# Patient Record
Sex: Female | Born: 1945
Health system: Southern US, Community
[De-identification: ages and names within clinical notes are randomized; demographics above are authoritative.]

## PROBLEM LIST (undated history)

## (undated) DIAGNOSIS — D899 Disorder involving the immune mechanism, unspecified: Secondary | ICD-10-CM

## (undated) DIAGNOSIS — Z8249 Family history of ischemic heart disease and other diseases of the circulatory system: Secondary | ICD-10-CM

## (undated) DIAGNOSIS — M810 Age-related osteoporosis without current pathological fracture: Secondary | ICD-10-CM

## (undated) DIAGNOSIS — M1990 Unspecified osteoarthritis, unspecified site: Secondary | ICD-10-CM

## (undated) DIAGNOSIS — E785 Hyperlipidemia, unspecified: Secondary | ICD-10-CM

## (undated) DIAGNOSIS — R0600 Dyspnea, unspecified: Secondary | ICD-10-CM

## (undated) DIAGNOSIS — H539 Unspecified visual disturbance: Secondary | ICD-10-CM

## (undated) DIAGNOSIS — C50919 Malignant neoplasm of unspecified site of unspecified female breast: Secondary | ICD-10-CM

## (undated) DIAGNOSIS — M419 Scoliosis, unspecified: Secondary | ICD-10-CM

## (undated) DIAGNOSIS — M797 Fibromyalgia: Secondary | ICD-10-CM

## (undated) DIAGNOSIS — M3500 Sicca syndrome, unspecified: Secondary | ICD-10-CM

## (undated) DIAGNOSIS — N3010 Interstitial cystitis (chronic) without hematuria: Secondary | ICD-10-CM

## (undated) DIAGNOSIS — C801 Malignant (primary) neoplasm, unspecified: Secondary | ICD-10-CM

## (undated) HISTORY — DX: Age-related osteoporosis without current pathological fracture: M81.0

## (undated) HISTORY — PX: APPENDECTOMY: SHX54

## (undated) HISTORY — DX: Sjogren syndrome, unspecified: M35.00

## (undated) HISTORY — DX: Dyspnea, unspecified: R06.00

## (undated) HISTORY — DX: Family history of ischemic heart disease and other diseases of the circulatory system: Z82.49

## (undated) HISTORY — DX: Fibromyalgia: M79.7

## (undated) HISTORY — DX: Disorder involving the immune mechanism, unspecified: D89.9

## (undated) HISTORY — DX: Unspecified osteoarthritis, unspecified site: M19.90

## (undated) HISTORY — PX: TUMOR REMOVAL: SHX12

## (undated) HISTORY — DX: Malignant neoplasm of unspecified site of unspecified female breast: C50.919

## (undated) HISTORY — DX: Unspecified visual disturbance: H53.9

## (undated) HISTORY — DX: Malignant (primary) neoplasm, unspecified: C80.1

## (undated) HISTORY — PX: MASTECTOMY: SHX3

## (undated) HISTORY — DX: Interstitial cystitis (chronic) without hematuria: N30.10

## (undated) HISTORY — DX: Hyperlipidemia, unspecified: E78.5

## (undated) HISTORY — DX: Scoliosis, unspecified: M41.9

---

## 1998-05-17 ENCOUNTER — Other Ambulatory Visit: Admission: RE | Admit: 1998-05-17 | Discharge: 1998-05-17 | Payer: Self-pay | Admitting: Surgery

## 1998-05-21 ENCOUNTER — Ambulatory Visit (HOSPITAL_BASED_OUTPATIENT_CLINIC_OR_DEPARTMENT_OTHER): Admission: RE | Admit: 1998-05-21 | Discharge: 1998-05-21 | Payer: Self-pay | Admitting: Surgery

## 1998-06-27 ENCOUNTER — Inpatient Hospital Stay (HOSPITAL_COMMUNITY): Admission: RE | Admit: 1998-06-27 | Discharge: 1998-06-29 | Payer: Self-pay | Admitting: Surgery

## 1998-08-13 ENCOUNTER — Other Ambulatory Visit: Admission: RE | Admit: 1998-08-13 | Discharge: 1998-08-13 | Payer: Self-pay | Admitting: *Deleted

## 1998-09-18 ENCOUNTER — Other Ambulatory Visit: Admission: RE | Admit: 1998-09-18 | Discharge: 1998-09-18 | Payer: Self-pay | Admitting: *Deleted

## 1998-10-16 ENCOUNTER — Ambulatory Visit (HOSPITAL_BASED_OUTPATIENT_CLINIC_OR_DEPARTMENT_OTHER): Admission: RE | Admit: 1998-10-16 | Discharge: 1998-10-16 | Payer: Self-pay | Admitting: Plastic Surgery

## 1998-12-14 ENCOUNTER — Ambulatory Visit (HOSPITAL_BASED_OUTPATIENT_CLINIC_OR_DEPARTMENT_OTHER): Admission: RE | Admit: 1998-12-14 | Discharge: 1998-12-14 | Payer: Self-pay | Admitting: Plastic Surgery

## 2000-02-21 ENCOUNTER — Other Ambulatory Visit: Admission: RE | Admit: 2000-02-21 | Discharge: 2000-02-21 | Payer: Self-pay | Admitting: *Deleted

## 2001-03-01 ENCOUNTER — Encounter: Payer: Self-pay | Admitting: Internal Medicine

## 2001-03-01 ENCOUNTER — Encounter: Admission: RE | Admit: 2001-03-01 | Discharge: 2001-03-01 | Payer: Self-pay | Admitting: Internal Medicine

## 2001-03-01 ENCOUNTER — Other Ambulatory Visit: Admission: RE | Admit: 2001-03-01 | Discharge: 2001-03-01 | Payer: Self-pay | Admitting: Internal Medicine

## 2001-03-08 ENCOUNTER — Encounter: Payer: Self-pay | Admitting: Internal Medicine

## 2001-03-08 ENCOUNTER — Encounter: Admission: RE | Admit: 2001-03-08 | Discharge: 2001-03-08 | Payer: Self-pay | Admitting: Internal Medicine

## 2001-03-12 ENCOUNTER — Encounter: Payer: Self-pay | Admitting: Internal Medicine

## 2001-03-12 ENCOUNTER — Ambulatory Visit (HOSPITAL_COMMUNITY): Admission: RE | Admit: 2001-03-12 | Discharge: 2001-03-12 | Payer: Self-pay | Admitting: Internal Medicine

## 2001-03-23 ENCOUNTER — Encounter: Payer: Self-pay | Admitting: Oncology

## 2001-03-23 ENCOUNTER — Ambulatory Visit (HOSPITAL_COMMUNITY): Admission: RE | Admit: 2001-03-23 | Discharge: 2001-03-23 | Payer: Self-pay | Admitting: Oncology

## 2001-05-28 ENCOUNTER — Encounter: Admission: RE | Admit: 2001-05-28 | Discharge: 2001-05-28 | Payer: Self-pay | Admitting: Oncology

## 2001-05-28 ENCOUNTER — Encounter: Payer: Self-pay | Admitting: Oncology

## 2001-06-15 ENCOUNTER — Encounter: Admission: RE | Admit: 2001-06-15 | Discharge: 2001-06-15 | Payer: Self-pay | Admitting: Oncology

## 2001-06-15 ENCOUNTER — Encounter: Payer: Self-pay | Admitting: Oncology

## 2001-12-23 ENCOUNTER — Encounter: Payer: Self-pay | Admitting: Oncology

## 2001-12-23 ENCOUNTER — Ambulatory Visit (HOSPITAL_COMMUNITY): Admission: RE | Admit: 2001-12-23 | Discharge: 2001-12-23 | Payer: Self-pay | Admitting: Oncology

## 2003-03-23 ENCOUNTER — Encounter: Payer: Self-pay | Admitting: Oncology

## 2003-03-23 ENCOUNTER — Ambulatory Visit (HOSPITAL_COMMUNITY): Admission: RE | Admit: 2003-03-23 | Discharge: 2003-03-23 | Payer: Self-pay | Admitting: Oncology

## 2006-02-18 ENCOUNTER — Encounter: Payer: Self-pay | Admitting: Surgery

## 2006-05-19 ENCOUNTER — Ambulatory Visit (HOSPITAL_COMMUNITY): Admission: RE | Admit: 2006-05-19 | Discharge: 2006-05-19 | Payer: Self-pay | Admitting: Gastroenterology

## 2006-05-19 ENCOUNTER — Encounter (INDEPENDENT_AMBULATORY_CARE_PROVIDER_SITE_OTHER): Payer: Self-pay | Admitting: Specialist

## 2006-06-04 ENCOUNTER — Encounter: Admission: RE | Admit: 2006-06-04 | Discharge: 2006-06-04 | Payer: Self-pay | Admitting: *Deleted

## 2006-07-06 ENCOUNTER — Ambulatory Visit (HOSPITAL_BASED_OUTPATIENT_CLINIC_OR_DEPARTMENT_OTHER): Admission: RE | Admit: 2006-07-06 | Discharge: 2006-07-07 | Payer: Self-pay | Admitting: Plastic Surgery

## 2009-03-15 ENCOUNTER — Other Ambulatory Visit: Admission: RE | Admit: 2009-03-15 | Discharge: 2009-03-15 | Payer: Self-pay | Admitting: Family Medicine

## 2010-04-17 ENCOUNTER — Other Ambulatory Visit: Admission: RE | Admit: 2010-04-17 | Discharge: 2010-04-17 | Payer: Self-pay | Admitting: Family Medicine

## 2011-01-16 ENCOUNTER — Other Ambulatory Visit: Payer: Self-pay | Admitting: Obstetrics and Gynecology

## 2011-03-07 NOTE — Op Note (Signed)
Shelby Peterson, Shelby Peterson           ACCOUNT NO.:  [id number]   MEDICAL RECORD NO.:  [id number]          PATIENT TYPE:  AMB   LOCATION:  DSC                          FACILITY:  [facility]   PHYSICIAN:  Alfredia Ferguson, M.D.  DATE OF BIRTH:  07-Apr-1946   DATE OF PROCEDURE:  07/06/2006  DATE OF DISCHARGE:                                 OPERATIVE REPORT   PREOPERATIVE DIAGNOSES:  1.  History of breast cancer.  2.  Acquired absence bilateral breasts.  3.  Bilateral capsular contracture.  4.  Contour irregularities both breasts.  5.  Ill-defined right inframammary crease.   POSTOP DIAGNOSES:  1.  History of breast cancer.  2.  Acquired absence bilateral breasts.  3.  Bilateral capsular contracture.  4.  Contour irregularities both breasts.  5.  Ill-defined right inframammary crease.   OPERATIONS PERFORMED:  1.  Bilateral capsulotomy and exchange of saline implants with silicone gel      implants [employer].  2.  Revision of right inframammary crease with the creation of new higher      inframammary crease.  3.  Injection of autologous fat harvested from right axillary area to      correct contour irregularities of right lateral reconstructed breast and      left medial reconstructed breast.   SURGEON:  Alfredia Ferguson, M.D.   ANESTHESIA:  General laryngeal mask anesthesia.   INDICATIONS FOR SURGERY:  This is a 65 year old woman who has previously had  breast cancer, who underwent bilateral mastectomies in 1999.  She has  irregularities of both breasts with capsular contracture and contour  irregularities.  She wishes to undergo removal of her bilateral saline  implants and replacement with silicone gel implants.  The patient also would  like to attempt to correct some indentations in the lateral aspect of her  right reconstructed breast, and medial aspect of her left reconstructed  breast.  The plan is to the harvest some autologous fat from the right  axillary area which  is fairly full and reinjected that fat, in relatively  small volumes, in the areas of contour irregularity.  The patient  understands the risk of silicone gel implants including uncertain life span,  bleeding, infection, hematoma, seroma, capsular contracture, rippling of the  implant, malposition of the implant, the need to replace the implant on  multiple occasions over her lifetime, leakage of the silicone gel, and the  need to perform routine surveillance on the implant by way of MRI to  indicate when the implant has broken.  In spite of these risks of breast  reconstruction, the patient wishes to proceed with the reconstruction.   DESCRIPTION OF SURGERY:  After adequate general laryngeal mask anesthesia  had been induced.  The patient's chest was prepped with Betadine and draped  with sterile drapes.  Skin marks had previously been placed outlining the  natural dimensions of the breast and the desired location of the implant.  All pressure points had been inspected and well padded.  PAS hose were in  place.   Attention was first directed to the left side.  The  scar of the original  mastectomy had separated fairly widely.  I decided to remove the scar that  traversed through the reconstructed areola.  Once the scar had been removed,  the superior and inferior breast flaps were undermined for a distance of  several centimeters.  Pectoralis muscle was opened in the transverse fashion  entering into the capsule on the implant.  The patient had a 440 mL textured  saline implant, anatomic shaped.  The implant was removed.   A circumferential capsulotomy was performed.  The pocket was developed  medially and laterally in order to give a bit more room for the implant.  Anterior capsulotomy was performed in radial cut fashions.  The pocket was  irrigated with saline and meticulous hemostasis was achieved.  A 492 mL  style 15 Inamed silicone filled gel implant was placed in the pocket.    Once the implant was in place the pectoralis muscle was reclosed with  multiple interrupted horizontal mattresses of 3-0 Vicryl.  Skin edges were  closed with interrupted 3-0 Monocryl suture.  Local anesthesia was  infiltrated in the right axillary region where there was a pocket of fat.  Approximately 60 mL of a mixture of 30 mL of 1/2% Marcaine 1:200,000  epinephrine with 60 mL of normal saline for injection.  This solution was  allowed to be in place for approximately 15 minutes before commencing.  Using approximately a 2 mm access incision, the fat was harvested with a 2-  mm cannula, approximately 30 mL of fat was harvested.   A similar incision on the right reconstructed breast was now made; and the  implant was removed.  A circumferential capsulotomy was performed.  The  patient had a very ill-defined inframammary crease on the right.  For this  reason I tacked the inframammary crease down to the chest wall with multiple  interrupted 3-0 Vicryl sutures.  This defined the crease in a much more  sharp fashion.  After completing similar capsulotomy incisions on the right,  the silicone gel implant 492 mL, style 15 was also placed.  Closure was  carried out in a similar fashion on the right.  Symmetry appeared to be  acceptable.   The previously harvested fat was cleansed with saline and was divided in 1  mL aliquots.  Using a 1-mm injection needle, blunt-tipped, the area of  depression in the right lateral reconstructed breast and the left medial was  injected with fat.  Approximately 15-20 mL of fat was injected in total  volume most of which was in the right side.  The contour deformity was  certainly improved, although not perfected.  The chest wall was now cleansed  and dried.  Steri-Strips were applied to the incision.  Bulky dressings were  placed over each breast followed by circumferential wrap with a 6-inch Ace bandage.  The patient tolerated the procedure well.  She was  awakened,  extubated, and transported to the recovery room in satisfactory addition.  Estimated blood loss for the procedure was minimal.      W. Delia Chimes, M.D.  Electronically Signed     WBB/MEDQ  D:  07/06/2006  T:  07/06/2006  Job:  [id number]

## 2011-03-07 NOTE — Op Note (Signed)
Shelby Peterson, Shelby Peterson           ACCOUNT NO.:  [id number]   MEDICAL RECORD NO.:  [id number]          PATIENT TYPE:  AMB   LOCATION:  ENDO                         FACILITY:  [facility]   PHYSICIAN:  Petra Kuba, M.D.    DATE OF BIRTH:  November 11, 1945   DATE OF PROCEDURE:  05/19/2006  DATE OF DISCHARGE:                                 OPERATIVE REPORT   PROCEDURE:  Colonoscopy with biopsy.   INDICATION:  Multiple GI complaints, due for colonic screening, history of  breast cancer.   Consent was signed after risks, benefits, medicines and options thoroughly  discussed in the office.   MEDICINE USED:  Fentanyl 75 mcg and Versed 7.5 mg   PROCEDURE:  Rectal inspection is pertinent for external hemorrhoids, small.  Digital exam was negative.  Video pediatric adjustable colonoscope was  inserted with abdominal pressure.  Fairly easily advanced around the colon  to the cecum.  No obvious abnormalities seen on insertion.  The cecum was  identified by the appendiceal orifice in the ileocecal valve.  _________  scope was inserted a short ways into the terminal ileum which was normal.  Photo documentation was obtained.  The scope was slowly withdrawn. The prep  was adequate.  There was some liquid stool removed with washing and  suctioning.  Random biopsies to the TI were obtained and put in the first  container.  Random biopsies of the colon were obtained and put in the second  container.  Slow withdraw through the colon.  No abnormalities were seen as  we slowly withdrew back to the rectum.  Anal rectal pull through and  retroflexing confirmed some small hemorrhoids.  Scope was drained,  readvanced toward the left side of the colon.  Air was suctioned and scope  removed.  The patient tolerated the procedure well.  There were no obvious  or immediate complications endoscopically.   DIAGNOSES:  1.  Internal/external hemorrhoids  2.  Otherwise within normal limits to the terminal ileum, status  post random      biopsies throughout to rule out microscopic abnormality.   PLAN:  1.  We will await pathology.  2.  Follow up p.r.n. or in two months to recheck symptoms to make sure no      further work up plans are needed or other medicine trial.  3.  Otherwise repeat colon screening in five to 10 years.           ______________________________  Petra Kuba, M.D.     MEM/MEDQ  D:  05/19/2006  T:  05/20/2006  Job:  [id number]   cc:   Jamison Neighbor, M.D.  Valentino Hue. Magrinat, M.D.  Gerri Spore B. Earlene Plater, M.D.  Tish Frederickson. Earlene Plater, M.D.

## 2012-04-28 ENCOUNTER — Other Ambulatory Visit: Payer: Self-pay | Admitting: Obstetrics and Gynecology

## 2012-04-28 DIAGNOSIS — Z780 Asymptomatic menopausal state: Secondary | ICD-10-CM

## 2012-05-10 ENCOUNTER — Ambulatory Visit
Admission: RE | Admit: 2012-05-10 | Discharge: 2012-05-10 | Disposition: A | Discharge status: Home / Self Care | Payer: BC Managed Care – PPO | Source: Ambulatory Visit | Attending: Obstetrics and Gynecology | Admitting: Obstetrics and Gynecology

## 2012-05-10 DIAGNOSIS — Z780 Asymptomatic menopausal state: Secondary | ICD-10-CM

## 2012-05-19 ENCOUNTER — Other Ambulatory Visit: Payer: Self-pay | Admitting: Dermatology

## 2012-08-29 DIAGNOSIS — N320 Bladder-neck obstruction: Secondary | ICD-10-CM | POA: Insufficient documentation

## 2012-08-29 DIAGNOSIS — N952 Postmenopausal atrophic vaginitis: Secondary | ICD-10-CM | POA: Insufficient documentation

## 2012-10-14 ENCOUNTER — Other Ambulatory Visit (HOSPITAL_COMMUNITY): Payer: Self-pay | Admitting: Cardiovascular Disease

## 2012-10-14 DIAGNOSIS — E785 Hyperlipidemia, unspecified: Secondary | ICD-10-CM

## 2012-10-14 DIAGNOSIS — R0602 Shortness of breath: Secondary | ICD-10-CM

## 2012-10-18 ENCOUNTER — Ambulatory Visit (HOSPITAL_COMMUNITY)
Admission: RE | Admit: 2012-10-18 | Discharge: 2012-10-18 | Disposition: A | Discharge status: Home / Self Care | Payer: BC Managed Care – PPO | Source: Ambulatory Visit | Attending: Cardiovascular Disease | Admitting: Cardiovascular Disease

## 2012-10-18 DIAGNOSIS — E785 Hyperlipidemia, unspecified: Secondary | ICD-10-CM | POA: Insufficient documentation

## 2012-10-18 DIAGNOSIS — Z853 Personal history of malignant neoplasm of breast: Secondary | ICD-10-CM | POA: Insufficient documentation

## 2012-10-18 DIAGNOSIS — I379 Nonrheumatic pulmonary valve disorder, unspecified: Secondary | ICD-10-CM | POA: Insufficient documentation

## 2012-10-18 DIAGNOSIS — R0989 Other specified symptoms and signs involving the circulatory and respiratory systems: Secondary | ICD-10-CM | POA: Insufficient documentation

## 2012-10-18 DIAGNOSIS — Z8249 Family history of ischemic heart disease and other diseases of the circulatory system: Secondary | ICD-10-CM | POA: Insufficient documentation

## 2012-10-18 DIAGNOSIS — I079 Rheumatic tricuspid valve disease, unspecified: Secondary | ICD-10-CM | POA: Insufficient documentation

## 2012-10-18 DIAGNOSIS — R0609 Other forms of dyspnea: Secondary | ICD-10-CM | POA: Insufficient documentation

## 2012-10-18 NOTE — Progress Notes (Signed)
[facility]   2D echo completed 10/18/2012.   Cindy Lemoyne Scarpati, RDCS   

## 2012-10-19 ENCOUNTER — Ambulatory Visit (HOSPITAL_COMMUNITY)
Admission: RE | Admit: 2012-10-19 | Discharge: 2012-10-19 | Disposition: A | Discharge status: Home / Self Care | Payer: BC Managed Care – PPO | Source: Ambulatory Visit | Attending: Cardiovascular Disease | Admitting: Cardiovascular Disease

## 2012-10-19 DIAGNOSIS — Z8249 Family history of ischemic heart disease and other diseases of the circulatory system: Secondary | ICD-10-CM | POA: Insufficient documentation

## 2012-10-19 DIAGNOSIS — E785 Hyperlipidemia, unspecified: Secondary | ICD-10-CM | POA: Insufficient documentation

## 2012-10-19 DIAGNOSIS — M549 Dorsalgia, unspecified: Secondary | ICD-10-CM | POA: Insufficient documentation

## 2012-10-19 DIAGNOSIS — R0602 Shortness of breath: Secondary | ICD-10-CM

## 2012-10-19 DIAGNOSIS — R42 Dizziness and giddiness: Secondary | ICD-10-CM | POA: Insufficient documentation

## 2012-10-19 DIAGNOSIS — R0609 Other forms of dyspnea: Secondary | ICD-10-CM | POA: Insufficient documentation

## 2012-10-19 DIAGNOSIS — M25519 Pain in unspecified shoulder: Secondary | ICD-10-CM | POA: Insufficient documentation

## 2012-10-19 DIAGNOSIS — R0989 Other specified symptoms and signs involving the circulatory and respiratory systems: Secondary | ICD-10-CM | POA: Insufficient documentation

## 2012-10-19 DIAGNOSIS — R5381 Other malaise: Secondary | ICD-10-CM | POA: Insufficient documentation

## 2012-10-19 MED ORDER — REGADENOSON 0.4 MG/5ML IV SOLN
0.4000 mg | Freq: Once | INTRAVENOUS | Status: AC
  Administered 2012-10-19: 0.4 mg via INTRAVENOUS

## 2012-10-19 MED ORDER — TECHNETIUM TC 99M SESTAMIBI GENERIC - CARDIOLITE
10.3000 | Freq: Once | INTRAVENOUS | Status: AC | PRN
  Administered 2012-10-19: 10 via INTRAVENOUS

## 2012-10-19 MED ORDER — TECHNETIUM TC 99M SESTAMIBI GENERIC - CARDIOLITE
30.2000 | Freq: Once | INTRAVENOUS | Status: AC | PRN
  Administered 2012-10-19: 30.2 via INTRAVENOUS

## 2012-10-19 NOTE — Procedures (Addendum)
[facility] [address] [place] [address] [phone]  Cardiology Nuclear Med Study  Shelby Peterson is a 66 y.o. female     MRN : [id number]     DOB: [birth year]  Procedure Date: 10/19/2012  Nuclear Med Background Indication for Stress Test:  Evaluation for Ischemia History:  no prior cardiac history Cardiac Risk Factors: Family History - CAD and Lipids  Symptoms:  Dizziness, DOE, Fatigue and Back and shoulder pain   Nuclear Pre-Procedure Caffeine/Decaff Intake:  12:30am NPO After: 11:00am   IV Site: R Antecubital  IV 0.9% NS with Angio Cath:  22g  Chest Size (in):  n/a IV Started by: Koren Shiver, CNMT  Height: 5\' 9"  (1.753 m)  Cup Size: B  BMI:  Body mass index is 22.89 kg/(m^2). Weight:  155 lb (70.308 kg)   Tech Comments:  n/a    Nuclear Med Study 1 or 2 day study: 1 day  Stress Test Type:  Lexiscan  Order Authorizing Provider:  Nanetta Batty, MD   Resting Radionuclide: Technetium 47m Sestamibi  Resting Radionuclide Dose: 10.3 mCi   Stress Radionuclide:  Technetium 35m Sestamibi  Stress Radionuclide Dose: 30.2 mCi           Stress Protocol Rest HR: 62 Stress HR: 93  Rest BP: 118/77 Stress BP: 133/71  Exercise Time (min): n/a METS: n/a   Predicted Max HR: 155 bpm % Max HR: 60 bpm Rate Pressure Product: 46962   Dose of Adenosine (mg):  n/a Dose of Lexiscan: 0.4 mg  Dose of Atropine (mg): n/a Dose of Dobutamine: n/a mcg/kg/min (at max HR)  Stress Test Technologist: Esperanza Sheets, CCT Nuclear Technologist: Gonzella Lex, CNMT   Rest Procedure:  Myocardial perfusion imaging was performed at rest 45 minutes following the intravenous administration of Technetium 18m Sestamibi. Stress Procedure:  The patient received IV Lexiscan 0.4 mg over 15-seconds.  Technetium 32m Sestamibi injected at 30-seconds.  There were no significant changes with Lexiscan.  Quantitative spect images were obtained after a  45 minute delay.  Transient Ischemic Dilatation (Normal <1.22):  1.03 Lung/Heart Ratio (Normal <0.45):  0.22 QGS EDV:  79 ml QGS ESV:  29 ml LV Ejection Fraction: 64%     Rest ECG: NSR - Normal EKG  Stress ECG: No significant change from baseline ECG  QPS Raw Data Images:  There is a breast shadow that accounts for the anterior attenuation.  Stress Images:  There is decreased uptake in a very small area of the anterior wall. Otherwise normal perfusion. Rest Images:  Comparison with the stress images reveals no significant change. Subtraction (SDS):  There is a fixed anterior defect that is most consistent with breast attenuation. LV Wall Motion:  NL LV Function; NL Wall Motion  Impression Exercise Capacity:  Lexiscan with no exercise. BP Response:  Normal blood pressure response. Clinical Symptoms:  No significant symptoms noted. ECG Impression:  No significant ST segment change suggestive of ischemia. Comparison with Prior Nuclear Study: No previous nuclear study performed  Overall Impression:  Normal stress nuclear study. Mild breast attenuation artifact.    Thurmon Fair, MD  10/19/2012 5:08 PM

## 2012-10-26 ENCOUNTER — Institutional Professional Consult (permissible substitution): Payer: BC Managed Care – PPO | Admitting: Cardiovascular Disease

## 2013-02-14 ENCOUNTER — Encounter: Payer: Self-pay | Admitting: Cardiovascular Disease

## 2013-05-19 ENCOUNTER — Ambulatory Visit: Payer: BC Managed Care – PPO | Admitting: Cardiovascular Disease

## 2013-06-15 ENCOUNTER — Ambulatory Visit: Payer: BC Managed Care – PPO | Admitting: Cardiovascular Disease

## 2013-07-18 ENCOUNTER — Ambulatory Visit: Payer: BC Managed Care – PPO | Admitting: Cardiovascular Disease

## 2013-08-22 ENCOUNTER — Ambulatory Visit: Payer: 59 | Admitting: Cardiovascular Disease

## 2013-10-03 ENCOUNTER — Ambulatory Visit: Payer: BC Managed Care – PPO | Admitting: Cardiovascular Disease

## 2013-10-17 ENCOUNTER — Ambulatory Visit: Payer: BC Managed Care – PPO | Admitting: Cardiovascular Disease

## 2013-11-04 ENCOUNTER — Ambulatory Visit (INDEPENDENT_AMBULATORY_CARE_PROVIDER_SITE_OTHER): Payer: Medicare Other | Admitting: Cardiovascular Disease

## 2013-11-04 ENCOUNTER — Encounter: Payer: Self-pay | Admitting: Cardiovascular Disease

## 2013-11-04 VITALS — BP 110/80 | HR 71 | Ht 69.0 in | Wt 153.0 lb

## 2013-11-04 DIAGNOSIS — M797 Fibromyalgia: Secondary | ICD-10-CM | POA: Insufficient documentation

## 2013-11-04 DIAGNOSIS — E785 Hyperlipidemia, unspecified: Secondary | ICD-10-CM

## 2013-11-04 DIAGNOSIS — Z8249 Family history of ischemic heart disease and other diseases of the circulatory system: Secondary | ICD-10-CM

## 2013-11-04 DIAGNOSIS — Z79899 Other long term (current) drug therapy: Secondary | ICD-10-CM

## 2013-11-04 MED ORDER — CO Q10 200 MG PO CAPS: 200.0000 mg | ORAL_CAPSULE | Freq: Every day | ORAL | Status: DC

## 2013-11-04 MED ORDER — SIMVASTATIN 20 MG PO TABS: 20.0000 mg | ORAL_TABLET | Freq: Every day | ORAL | Status: DC

## 2013-11-04 NOTE — Patient Instructions (Signed)
  Your physician wants you to follow-up with him in : 1 year with Dr Andria Rhein will receive a reminder letter in the mail one month in advance. If you don't receive a letter, please call our office to schedule the follow-up appointment.   Your physician recommends that you return for lab work in: 2 months with Dr Gwenlyn Found   Your physician has recommended you make the following change in your medication: Start simvastatin 20mg  daily and Co Q10 200mg  daily

## 2013-11-04 NOTE — Assessment & Plan Note (Signed)
Patient has familial hyperlipidemia. She is statin intolerant specifically with Crestor. She is on Zetia. Her LDL particle number back and December 2013 was 1700 with an LDL of 140. I'm going to start her on simvastatin 20 mg a day, Q10 200 mg a day and we'll recheck a NMR LipoProfile 2 months. I will see her back in one year for followup. If her lipid profile is to call discussed referring her to the [facility] heart care lipid clinic.

## 2013-11-04 NOTE — Progress Notes (Signed)
11/04/2013 Shelby Peterson   11/28/1945  [id number]  Primary Physician  Melinda Crutch, MD Primary Cardiologist: Lorretta Harp MD Renae Gloss   HPI:  The patient is a delightful 68 year old, mildly overweight, married Caucasian female, mother of 1 child, grandmother to 1 step-grandchild who is self referred for cardiovascular evaluation. Her risk factor profile is positive for hyperlipidemia and family history. Her father had an MI at age 46. She is not diabetic, hypertensive nor does she smoke. She drinks occasional red wine. She has noticed increasing dyspnea on exertion and increasing fatigue as well as some back and shoulder pain.   Her past history otherwise is remarkable for having had breast cancer in 1999 and bilateral mastectomies, as well as an oophorectomy. She has a diagnosis of fibromyalgia and Sjogren syndrome. She has back pain and has had epidural injections by Dr. Veverly Fells and Dr. Nelva Bush. She also has interstitial cystitis followed by Dr. Lawrence Santiago. She has been tried on Zetia in the past with little benefit and was worried about starting on a statin because of all of her aches and pains.   I performed a Myoview stress test on her December 2013 because of chest pain which was normal except for breast attenuation artifact. She was statin intolerant to her Crestor. I'm going to rechallenge her with simvastatin.     Current Outpatient Prescriptions  Medication Sig Dispense Refill  . acetaminophen (TYLENOL) 650 MG CR tablet Take 650 mg by mouth every 8 (eight) hours as needed for pain.      Marland Kitchen acyclovir (ZOVIRAX) 400 MG tablet Take 400 mg by mouth 2 (two) times daily as needed.       . Cholecalciferol (VITAMIN D-3) 5000 UNITS TABS Take 1 tablet by mouth once a week.      . cycloSPORINE (RESTASIS) 0.05 % ophthalmic emulsion Place 1 drop into both eyes as needed.      . ezetimibe (ZETIA) 10 MG tablet Take 10 mg by mouth daily.      . fish oil-omega-3 fatty acids 1000  MG capsule Take 1 g by mouth daily.      Marland Kitchen HYDROcodone-acetaminophen (NORCO/VICODIN) 5-325 MG per tablet Take 1 tablet by mouth every 6 (six) hours as needed for pain.      . magnesium oxide (MAG-OX) 400 MG tablet Take 400 mg by mouth daily.      . meloxicam (MOBIC) 7.5 MG tablet Take 7.5 mg by mouth daily.      . Multiple Vitamins-Minerals (PRESERVISION AREDS PO) Take 1 tablet by mouth daily.      . traZODone (DESYREL) 100 MG tablet Take 100 mg by mouth at bedtime.      Marland Kitchen trimethoprim (TRIMPEX) 100 MG tablet Take 100 mg by mouth daily.      . Coenzyme Q10 (CO Q10) 200 MG CAPS Take 200 mg by mouth daily.  30 capsule  6  . simvastatin (ZOCOR) 20 MG tablet Take 1 tablet (20 mg total) by mouth at bedtime.  30 tablet  6   No current facility-administered medications for this visit.    No Known Allergies  History   Social History  . Marital Status: Married    Spouse Name: N/A    Number of Children: N/A  . Years of Education: N/A   Occupational History  . Not on file.   Social History Main Topics  . Smoking status: Never Smoker   . Smokeless tobacco: Not on file  . Alcohol Use:  Not on file  . Drug Use: Not on file  . Sexual Activity: Not on file   Other Topics Concern  . Not on file   Social History Narrative  . No narrative on file     Review of Systems: General: negative for chills, fever, night sweats or weight changes.  Cardiovascular: negative for chest pain, dyspnea on exertion, edema, orthopnea, palpitations, paroxysmal nocturnal dyspnea or shortness of breath Dermatological: negative for rash Respiratory: negative for cough or wheezing Urologic: negative for hematuria Abdominal: negative for nausea, vomiting, diarrhea, bright red blood per rectum, melena, or hematemesis Neurologic: negative for visual changes, syncope, or dizziness All other systems reviewed and are otherwise negative except as noted above.    Blood pressure 110/80, pulse 71, height 5\' 9"  (1.753  m), weight 153 lb (69.4 kg).  General appearance: alert and no distress Neck: no adenopathy, no carotid bruit, no JVD, supple, symmetrical, trachea midline and thyroid not enlarged, symmetric, no tenderness/mass/nodules Lungs: clear to auscultation bilaterally Heart: regular rate and rhythm, S1, S2 normal, no murmur, click, rub or gallop Extremities: extremities normal, atraumatic, no cyanosis or edema  EKG normal sinus rhythm at 71 with no ST or T wave changes  ASSESSMENT AND PLAN:   Hyperlipidemia Patient has familial hyperlipidemia. She is statin intolerant specifically with Crestor. She is on Zetia. Her LDL particle number back and December 2013 was 1700 with an LDL of 140. I'm going to start her on simvastatin 20 mg a day, Q10 200 mg a day and we'll recheck a NMR LipoProfile 2 months. I will see her back in one year for followup. If her lipid profile is to call discussed referring her to the [facility] heart care lipid clinic.      Lorretta Harp MD FACP,FACC,FAHA, [facility] 11/04/2013 10:51 AM

## 2016-01-18 ENCOUNTER — Other Ambulatory Visit: Payer: Self-pay | Admitting: Obstetrics and Gynecology

## 2016-01-21 ENCOUNTER — Other Ambulatory Visit: Payer: Self-pay | Admitting: Obstetrics and Gynecology

## 2016-01-21 DIAGNOSIS — E2839 Other primary ovarian failure: Secondary | ICD-10-CM

## 2016-01-21 LAB — CYTOLOGY - PAP

## 2016-02-08 ENCOUNTER — Ambulatory Visit
Admission: RE | Admit: 2016-02-08 | Discharge: 2016-02-08 | Disposition: A | Discharge status: Home / Self Care | Payer: Medicare Other | Source: Ambulatory Visit | Attending: Obstetrics and Gynecology | Admitting: Obstetrics and Gynecology

## 2016-02-08 DIAGNOSIS — E2839 Other primary ovarian failure: Secondary | ICD-10-CM

## 2016-12-18 ENCOUNTER — Ambulatory Visit (INDEPENDENT_AMBULATORY_CARE_PROVIDER_SITE_OTHER): Payer: 59 | Admitting: Neurology

## 2016-12-18 ENCOUNTER — Encounter: Payer: Self-pay | Admitting: Neurology

## 2016-12-18 ENCOUNTER — Encounter (INDEPENDENT_AMBULATORY_CARE_PROVIDER_SITE_OTHER): Payer: Self-pay

## 2016-12-18 VITALS — BP 126/66 | HR 70 | Resp 14 | Ht 69.0 in | Wt 162.0 lb

## 2016-12-18 DIAGNOSIS — S069XAA Unspecified intracranial injury with loss of consciousness status unknown, initial encounter: Secondary | ICD-10-CM | POA: Insufficient documentation

## 2016-12-18 DIAGNOSIS — S069X9A Unspecified intracranial injury with loss of consciousness of unspecified duration, initial encounter: Secondary | ICD-10-CM | POA: Insufficient documentation

## 2016-12-18 DIAGNOSIS — R42 Dizziness and giddiness: Secondary | ICD-10-CM | POA: Diagnosis not present

## 2016-12-18 DIAGNOSIS — F0781 Postconcussional syndrome: Secondary | ICD-10-CM

## 2016-12-18 DIAGNOSIS — G44329 Chronic post-traumatic headache, not intractable: Secondary | ICD-10-CM | POA: Diagnosis not present

## 2016-12-18 DIAGNOSIS — S069X1A Unspecified intracranial injury with loss of consciousness of 30 minutes or less, initial encounter: Secondary | ICD-10-CM

## 2016-12-18 MED ORDER — ALPRAZOLAM 0.5 MG PO TABS: ORAL_TABLET | ORAL | 0 refills | Status: DC

## 2016-12-18 MED ORDER — DONEPEZIL HCL 10 MG PO TABS: 10.0000 mg | ORAL_TABLET | Freq: Every day | ORAL | 3 refills | Status: DC
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
## 2016-12-18 NOTE — Patient Instructions (Signed)
Concussion, Adult A concussion is a brain injury from a direct hit (blow) to the head or body. This injury causes the brain to shake quickly back and forth inside the skull. It is caused by:  A hit to the head.  A quick and sudden movement (jolt) of the head or neck. How fast you will get better from a concussion depends on many things like how bad your concussion was, what part of your brain was hurt, how old you are, and how healthy you were before the concussion. Recovery can take time. It is important to wait to return to activity until a doctor says it is safe and your symptoms are all gone. Follow these instructions at home: Activity   Limit activities that need a lot of thought or concentration. These include:  Homework or work for your job.  Watching TV.  Computer work.  Playing memory games and puzzles.  Rest. Rest helps the brain to heal. Make sure you:  Get plenty of sleep at night. Do not stay up late.  Go to bed at the same time every day.  Rest during the day. Take naps or rest breaks when you feel tired.  It can be dangerous if you get another concussion before the first one has healed Do not do activities that could cause a second concussion, such as riding a bike or playing sports.  Ask your doctor when you can return to your normal activities, like driving, riding a bike, or using machinery. Your ability to react may be slower. Do not do these activities if you are dizzy. Your doctor will likely give you a plan for slowly going back to activities. General instructions   Take over-the-counter and prescription medicines only as told by your doctor.  Do not drink alcohol until your doctor says you can.  If it is harder than usual to remember things, write them down.  If you are easily distracted, try to do one thing at a time. For example, do not try to watch TV while making dinner.  Talk with family members or close friends when you need to make important  decisions.  Watch your symptoms and tell other people to do the same. Other problems (complications) can happen after a concussion. Older adults with a brain injury may have a higher risk of serious problems, such as a blood clot in the brain.  Tell your teachers, school nurse, school counselor, coach, Product/process development scientist, or work Freight forwarder about your injury and symptoms. Tell them about what you can or cannot do. They should watch for:  More problems with attention or concentration.  More trouble remembering or learning new information.  More time needed to do tasks or assignments.  Being more annoyed (irritable) or having a harder time dealing with stress.  Any other symptoms that get worse.  Keep all follow-up visits as told by your health care provider. This is important. Prevention   It is very important that you donot get another brain injury, especially before you have healed. In rare cases, another injury can cause permanent brain damage, brain swelling, or death. You have the most risk if you get another head injury in the first 7-10 days after you were hurt before. To avoid injuries:  Wear a seat belt when you ride in a car.  Do not drink too much alcohol.  Avoid activities that could make you get a second concussion, like contact sports.  Wear a helmet when you do activities like:  a car.  ? Do not drink too much alcohol.  ? Avoid activities that could make you get a second concussion, like contact sports.  ? Wear a helmet when you do activities like:   Biking.   Skiing.   Skateboarding.   Skating.  ? Make your home safe by:   Removing things from the floor or stairs that could make you trip.   Using grab bars in bathrooms and handrails by stairs.   Placing non-slip mats on floors and in bathtubs.   Putting more light in dark areas.  Contact a doctor if:   Your symptoms get worse.   You have new symptoms.   You keep having symptoms for more than 2 weeks.  Get help right away if:   You have bad headaches, or your headaches get worse.   You have weakness in any part of your body.   You have loss of feeling (numbness).   You feel off  balance.   You keep throwing up (vomiting).   You feel more sleepy.   The black center of one eye (pupil) is bigger than the other one.   You twitch or shake violently (convulse) or have a seizure.   Your speech is not clear (is slurred).   You feel more tired, more confused, or more annoyed.   You do not recognize people or places.   You have neck pain.   It is hard to wake you up.   You have strange behavior changes.   You pass out (lose consciousness).  Summary   A concussion is a brain injury from a direct hit (blow) to the head or body.   This condition is treated with rest and careful watching of symptoms.   If you keep having symptoms for more than 2 weeks, call your doctor.  This information is not intended to replace advice given to you by your health care provider. Make sure you discuss any questions you have with your health care provider.  Document Released: 09/24/2009 Document Revised: 09/20/2016 Document Reviewed: 09/20/2016  Elsevier Interactive Patient Education  2017 [employer] Inc[employer]

## 2016-12-23 ENCOUNTER — Telehealth: Payer: Self-pay | Admitting: Neurology

## 2016-12-23 NOTE — Telephone Encounter (Signed)
Shelby Peterson images were not visible on the Epic, P system, she provided me with a CD-ROM-dvd that I was unable to open. I will ask for a report from her primary care physician and referring physician. Dr. Melinda Crutch.

## 2016-12-24 ENCOUNTER — Telehealth: Payer: Self-pay | Admitting: Neurology

## 2016-12-24 NOTE — Telephone Encounter (Signed)
Pt called said she would like to see Dr [facility]) [phone] for psyche eval and not Velora Heckler

## 2016-12-24 NOTE — Telephone Encounter (Signed)
Yes Noted I will send to Dr. Norval Gable.

## 2017-06-26 ENCOUNTER — Other Ambulatory Visit: Payer: Self-pay | Admitting: Physical Medicine and Rehabilitation

## 2017-06-26 DIAGNOSIS — M5136 Other intervertebral disc degeneration, lumbar region: Secondary | ICD-10-CM

## 2018-03-16 DIAGNOSIS — M720 Palmar fascial fibromatosis [Dupuytren]: Secondary | ICD-10-CM | POA: Insufficient documentation

## 2018-03-23 ENCOUNTER — Encounter: Payer: Self-pay | Admitting: Cardiovascular Disease

## 2018-03-23 ENCOUNTER — Telehealth: Payer: Self-pay | Admitting: Pharmacist

## 2018-03-23 ENCOUNTER — Ambulatory Visit: Payer: Medicare Other | Admitting: Cardiovascular Disease

## 2018-03-23 VITALS — BP 138/76 | HR 70 | Ht 69.0 in | Wt 157.0 lb

## 2018-03-23 DIAGNOSIS — Z8249 Family history of ischemic heart disease and other diseases of the circulatory system: Secondary | ICD-10-CM | POA: Diagnosis not present

## 2018-03-23 DIAGNOSIS — E7800 Pure hypercholesterolemia, unspecified: Secondary | ICD-10-CM

## 2018-03-23 LAB — LIPID PANEL
CHOL/HDL RATIO: 4 ratio (ref 0.0–4.4)
Cholesterol, Total: 241 mg/dL — ABNORMAL HIGH (ref 100–199)
HDL: 61 mg/dL (ref >39)
LDL CALC: 153 mg/dL — AB (ref 0–99)
TRIGLYCERIDES: 137 mg/dL (ref 0–149)
VLDL CHOLESTEROL CAL: 27 mg/dL (ref 5–40)

## 2018-03-23 LAB — HEPATIC FUNCTION PANEL
ALK PHOS: 85 IU/L (ref 39–117)
ALT: 13 IU/L (ref 0–32)
AST: 21 IU/L (ref 0–40)
Albumin: 4.6 g/dL (ref 3.5–4.8)
BILIRUBIN TOTAL: 0.3 mg/dL (ref 0.0–1.2)
Bilirubin, Direct: 0.1 mg/dL (ref 0.00–0.40)
Total Protein: 7 g/dL (ref 6.0–8.5)

## 2018-03-23 NOTE — Assessment & Plan Note (Signed)
History of hyperlipidemia not on statin therapy because of statin intolerance.  She does have a strong family history of heart disease with a father who died at age 71 of myocardial infarction.  We will explore the possibility of beginning a PCSK9 monoclonal injectable such as Repatha

## 2018-03-23 NOTE — Patient Instructions (Signed)

## 2018-03-23 NOTE — Progress Notes (Signed)
03/23/2018 Shelby Peterson   08/15/1946  [id number]  Primary Physician Lawerance Cruel, MD Primary Cardiologist: Lorretta Harp MD Lupe Carney, [place]  HPI:  Shelby Peterson is a 72 y.o.  mildly overweight, married Caucasian female, mother of 1 child, grandmother to 1 step-grandchild who is self referred for cardiovascular evaluation.  I last saw her in the office 11/04/2013.  Her risk factor profile is positive for hyperlipidemia and family history. Her father had an MI at age 2. She is not diabetic, hypertensive nor does she smoke. She drinks occasional red wine. She has noticed increasing dyspnea on exertion and increasing fatigue as well as some back and shoulder pain.   Her past history otherwise is remarkable for having had breast cancer in 1999 and bilateral mastectomies, as well as an oophorectomy. She has a diagnosis of fibromyalgia and Sjogren syndrome. She has back pain and has had epidural injections by Dr. Veverly Fells and Dr. Nelva Bush. She also has interstitial cystitis followed by Dr. Lawrence Santiago. She has been tried on Zetia in the past with little benefit and was worried about starting on a statin because of all of her aches and pains.   I performed a Myoview stress test on her December 2013 because of chest pain which was normal except for breast attenuation artifact. She was statin intolerant to her Crestor.  She apparently is statin intolerant and did not tolerate either Crestor or simvastatin.  We had a discussion concerning PCSK9 monoclonal inhibitors.  She is been asymptomatic since I saw her 4 years ago.   Current Meds  Medication Sig  . acetaminophen (TYLENOL) 650 MG CR tablet Take 650 mg by mouth every 8 (eight) hours as needed for pain.  Marland Kitchen acyclovir (ZOVIRAX) 400 MG tablet Take 400 mg by mouth 2 (two) times daily as needed.   Marland Kitchen amoxicillin (AMOXIL) 875 MG tablet Take 875 mg by mouth 2 (two) times daily.  . benzonatate (TESSALON) 200 MG capsule Take 200 mg by  mouth 3 (three) times daily as needed for cough.  . Cholecalciferol (VITAMIN D-3) 5000 UNITS TABS Take 1 tablet by mouth once a week.  . donepezil (ARICEPT) 10 MG tablet Take 1 tablet (10 mg total) by mouth at bedtime.  Marland Kitchen ezetimibe (ZETIA) 10 MG tablet Take 10 mg by mouth daily.  Marland Kitchen Fexofenadine HCl (ALLEGRA ALLERGY PO) Take by mouth.  . fish oil-omega-3 fatty acids 1000 MG capsule Take 1 g by mouth daily.  Marland Kitchen HYDROcodone-acetaminophen (NORCO/VICODIN) 5-325 MG per tablet Take 1 tablet by mouth every 6 (six) hours as needed for pain.  . meloxicam (MOBIC) 7.5 MG tablet Take 7.5 mg by mouth daily.  . Multiple Vitamins-Minerals (PRESERVISION AREDS PO) Take 1 tablet by mouth daily.  . traZODone (DESYREL) 100 MG tablet Take 100 mg by mouth at bedtime.  Marland Kitchen trimethoprim (TRIMPEX) 100 MG tablet Take 100 mg by mouth daily.     No Known Allergies  Social History   Socioeconomic History  . Marital status: Married    Spouse name: Not on file  . Number of children: 1  . Years of education: 34  . Highest education level: Not on file  Occupational History  . Occupation: Retired   Scientific laboratory technician  . Financial resource strain: Not on file  . Food insecurity:    Worry: Not on file    Inability: Not on file  . Transportation needs:    Medical: Not on file    Non-medical:  Not on file  Tobacco Use  . Smoking status: Never Smoker  . Smokeless tobacco: Never Used  Substance and Sexual Activity  . Alcohol use: Yes    Comment: 1 glass of wine a month   . Drug use: No  . Sexual activity: Not on file  Lifestyle  . Physical activity:    Days per week: Not on file    Minutes per session: Not on file  . Stress: Not on file  Relationships  . Social connections:    Talks on phone: Not on file    Gets together: Not on file    Attends religious service: Not on file    Active member of club or organization: Not on file    Attends meetings of clubs or organizations: Not on file    Relationship status: Not  on file  . Intimate partner violence:    Fear of current or ex partner: Not on file    Emotionally abused: Not on file    Physically abused: Not on file    Forced sexual activity: Not on file  Other Topics Concern  . Not on file  Social History Narrative   Drinks 2 cups of coffee a day      Review of Systems: General: negative for chills, fever, night sweats or weight changes.  Cardiovascular: negative for chest pain, dyspnea on exertion, edema, orthopnea, palpitations, paroxysmal nocturnal dyspnea or shortness of breath Dermatological: negative for rash Respiratory: negative for cough or wheezing Urologic: negative for hematuria Abdominal: negative for nausea, vomiting, diarrhea, bright red blood per rectum, melena, or hematemesis Neurologic: negative for visual changes, syncope, or dizziness All other systems reviewed and are otherwise negative except as noted above.    Blood pressure 138/76, pulse 70, height 5\' 9"  (1.753 m), weight 157 lb (71.2 kg).  General appearance: alert and no distress Neck: no adenopathy, no carotid bruit, no JVD, supple, symmetrical, trachea midline and thyroid not enlarged, symmetric, no tenderness/mass/nodules Lungs: clear to auscultation bilaterally Heart: regular rate and rhythm, S1, S2 normal, no murmur, click, rub or gallop Extremities: extremities normal, atraumatic, no cyanosis or edema Pulses: 2+ and symmetric  EKG sinus rhythm at 70 without ST or T wave changes.  There is an isolated PVC.  I personally reviewed this EKG.  ASSESSMENT AND PLAN:   Hyperlipidemia History of hyperlipidemia not on statin therapy because of statin intolerance.  She does have a strong family history of heart disease with a father who died at age 57 of myocardial infarction.  We will explore the possibility of beginning a PCSK9 monoclonal injectable such as [place] MD [facility], [facility] 03/23/2018 9:17 AM

## 2018-03-25 NOTE — Telephone Encounter (Signed)
Severe muscle pain with: Rosuvastatin 10mg  daily (~Oct/2015-Jan/2015) Simvastatin 20mg  daily (Jan/2015 to 12/2016)   Current lipid management therapy Ezetimibe 10mg  daily  Omega-3 fatty acids

## 2018-04-01 ENCOUNTER — Telehealth: Payer: Self-pay | Admitting: Pharmacist

## 2018-04-01 MED ORDER — EVOLOCUMAB 140 MG/ML ~~LOC~~ SOAJ: 140.0000 mg | SUBCUTANEOUS | 11 refills | Status: DC

## 2018-04-01 NOTE — Telephone Encounter (Signed)
Prior authorization for repatha approved until dec/2019.  Rx sent to [facility] per patient request.  Instructed to call back if unable to afford medication

## 2018-04-09 ENCOUNTER — Telehealth: Payer: Self-pay | Admitting: Pharmacist

## 2018-04-09 NOTE — Telephone Encounter (Signed)
*  Patient refuses injection at this time*  Medication approved by insurance with co-pay of $100. Patient assistance available but patient not interested on pursuing PCSK9i at this time.  Will like to try lifestyle modifications for now. Willing to consider low dose stating in the future.

## 2018-08-26 ENCOUNTER — Ambulatory Visit: Payer: Medicare Other | Admitting: Podiatry

## 2018-08-26 ENCOUNTER — Encounter: Payer: Self-pay | Admitting: Podiatry

## 2018-08-26 DIAGNOSIS — L601 Onycholysis: Secondary | ICD-10-CM | POA: Diagnosis not present

## 2018-08-26 DIAGNOSIS — L603 Nail dystrophy: Secondary | ICD-10-CM | POA: Diagnosis not present

## 2018-08-26 DIAGNOSIS — M79674 Pain in right toe(s): Secondary | ICD-10-CM

## 2018-08-26 NOTE — Patient Instructions (Signed)

## 2018-08-26 NOTE — Progress Notes (Signed)
Subjective:    Patient ID: Shelby Peterson, female    DOB: 1946-08-13, 72 y.o.   MRN: [id number]  HPI 72 year old female presents the office today for concerns of her right big toenail becoming thick discolored and started because more pain.  Last week she did hit it on her back in the nail started to loosen she had pain to the toenail as well as some bleeding to the toenail.  This been ongoing issue for her but is been getting worse.   Review of Systems  All other systems reviewed and are negative.  Past Medical History:  Diagnosis Date  . Cancer ([place])   . Dyspnea    2D ECHO, 10/18/2012 - EF-60-65%, normal  . Family history of early CAD    LEXISCAN, 10/19/2012 - normal  . Fibromyalgia   . Hyperlipidemia   . Immune disorder ([place])   . Osteoporosis   . Sjoegren syndrome   . Visual disorder     Past Surgical History:  Procedure Laterality Date  . MASTECTOMY Bilateral   . TUMOR REMOVAL     Benign on L ovary      Current Outpatient Medications:  .  acetaminophen (TYLENOL) 650 MG CR tablet, Take 650 mg by mouth every 8 (eight) hours as needed for pain., Disp: , Rfl:  .  acyclovir (ZOVIRAX) 400 MG tablet, Take 400 mg by mouth 2 (two) times daily as needed. , Disp: , Rfl:  .  amoxicillin (AMOXIL) 875 MG tablet, Take 875 mg by mouth 2 (two) times daily., Disp: , Rfl:  .  benzonatate (TESSALON) 200 MG capsule, Take 200 mg by mouth 3 (three) times daily as needed for cough., Disp: , Rfl:  .  Cholecalciferol (VITAMIN D-3) 5000 UNITS TABS, Take 1 tablet by mouth once a week., Disp: , Rfl:  .  donepezil (ARICEPT) 10 MG tablet, Take 1 tablet (10 mg total) by mouth at bedtime., Disp: 30 tablet, Rfl: 3 .  ezetimibe (ZETIA) 10 MG tablet, Take 10 mg by mouth daily., Disp: , Rfl:  .  Fexofenadine HCl (ALLEGRA ALLERGY PO), Take by mouth., Disp: , Rfl:  .  fish oil-omega-3 fatty acids 1000 MG capsule, Take 1 g by mouth daily., Disp: , Rfl:  .  HYDROcodone-acetaminophen (NORCO/VICODIN)  5-325 MG per tablet, Take 1 tablet by mouth every 6 (six) hours as needed for pain., Disp: , Rfl:  .  meloxicam (MOBIC) 7.5 MG tablet, Take 7.5 mg by mouth daily., Disp: , Rfl:  .  Multiple Vitamins-Minerals (PRESERVISION AREDS PO), Take 1 tablet by mouth daily., Disp: , Rfl:  .  traZODone (DESYREL) 100 MG tablet, Take 100 mg by mouth at bedtime., Disp: , Rfl:  .  trimethoprim (TRIMPEX) 100 MG tablet, Take 100 mg by mouth daily., Disp: , Rfl:   No Known Allergies      Objective:   Physical Exam  General: AAO x3, NAD  Dermatological: The right hallux toenail significantly dystrophic with yellow to brown to dark discoloration and is loose and underlying nail bed and only acute on the proximal nail border.  There is pain to the entire toenail and there is dried blood along the nail.  No surrounding erythema, ascending cellulitis.  No open lesions.  Vascular: Dorsalis Pedis artery and Posterior Tibial artery pedal pulses are 2/4 bilateral with immedate capillary fill time. There is no pain with calf compression, swelling, warmth, erythema.   Neruologic: Grossly intact via light touch bilateral. Protective threshold with Semmes Wienstein monofilament  intact to all pedal sites bilateral.   Musculoskeletal: No area pinpoint tenderness.  Muscular strength 5/5 in all groups tested bilateral.  Gait: Unassisted, Nonantalgic.     Assessment & Plan:  72 year old female right hallux symptomatic onychodystrophy, onycholysis -Treatment options discussed including all alternatives, risks, and complications -Etiology of symptoms were discussed -At this time, the patient is requesting partial nail removal with chemical matricectomy to the symptomatic portion of the nail. Risks and complications were discussed with the patient for which they understand and written consent was obtained. Under sterile conditions a total of 3 mL of a mixture of 2% lidocaine plain and 0.5% Marcaine plain was infiltrated in a  hallux block fashion. Once anesthetized, the skin was prepped in sterile fashion. A tourniquet was then applied. Next theright hallux nailwas then excised making sure to remove the entire offending nail border. Once the nails were ensured to be removed area was debrided and the underlying skin was intact. There is no purulence identified in the procedure. Next phenol was then applied under standard conditions and copiously irrigated. Silvadene was applied. A dry sterile dressing was applied. After application of the dressing the tourniquet was removed and there is found to be an immediate capillary refill time to the digit. The patient tolerated the procedure well any complications. Post procedure instructions were discussed the patient for which he verbally understood. Follow-up in one week for nail check or sooner if any problems are to arise. Discussed signs/symptoms of infection and directed to call the office immediately should any occur or go directly to the emergency room. In the meantime, encouraged to call the office with any questions, concerns, changes symptoms.  Trula Slade DPM

## 2018-09-03 ENCOUNTER — Ambulatory Visit (INDEPENDENT_AMBULATORY_CARE_PROVIDER_SITE_OTHER): Payer: Self-pay

## 2018-09-03 DIAGNOSIS — L601 Onycholysis: Secondary | ICD-10-CM

## 2018-09-03 NOTE — Patient Instructions (Signed)

## 2018-09-07 NOTE — Progress Notes (Signed)
Patient is here today for follow-up appointment, recent procedure performed 08/26/2018, removal of right hallux nail.  She states that overall she thinks the area is healing well, her only concern is at the center of the nailbed where the nail is red.  No redness, with the exception of a red area in the center of her nail bed, this is due to to trauma the nail received 2 weeks prior.  There was no redness, no erythema surrounding the nailbed.  There was a small amount of serosanguineous drainage coming from the nail, but no signs and symptoms of infection.  Discussed signs and symptoms of infection with patient.  I told her that the redness in the center of her nailbed was due to trauma to the nail and should start to resolve over the next few days.  Verbal and written instructions were given to the patient, she is to follow-up as needed with any acute symptom changes or questions.

## 2018-09-15 ENCOUNTER — Ambulatory Visit (INDEPENDENT_AMBULATORY_CARE_PROVIDER_SITE_OTHER): Payer: Self-pay

## 2018-09-15 DIAGNOSIS — L601 Onycholysis: Secondary | ICD-10-CM

## 2018-09-15 MED ORDER — CEPHALEXIN 500 MG PO CAPS: 500.0000 mg | ORAL_CAPSULE | Freq: Three times a day (TID) | ORAL | 0 refills | Status: DC

## 2018-09-20 NOTE — Progress Notes (Signed)
Patient is here today with concern about the healing in her right nail.  Recent procedure performed on 08/26/2018, removal of right hallux nail.  She states that the area is very sensitive and she is concerned with the healing.  Redness and some warmth at the base of the nail, no purulent drainage, no swelling.  The nailbed itself appears to be healing well from last visit, but some of the redness is a concern.    Discussed findings with Dr. Paulla Dolly, who prescribed Keflex 500 mg 3 times daily x7 days.  I advised the patient to discontinue Epson salt soaks, and to start utilizing soapy water soaks.  She is to follow-up in 1 week if there is no improvement.

## 2018-09-30 ENCOUNTER — Other Ambulatory Visit: Payer: Medicare Other

## 2019-04-06 ENCOUNTER — Other Ambulatory Visit: Payer: Self-pay | Admitting: Family Medicine

## 2019-04-06 DIAGNOSIS — M810 Age-related osteoporosis without current pathological fracture: Secondary | ICD-10-CM

## 2019-06-08 ENCOUNTER — Other Ambulatory Visit: Payer: Self-pay

## 2019-06-08 ENCOUNTER — Ambulatory Visit (INDEPENDENT_AMBULATORY_CARE_PROVIDER_SITE_OTHER): Payer: Medicare Other | Admitting: Cardiovascular Disease

## 2019-06-08 ENCOUNTER — Encounter: Payer: Self-pay | Admitting: Cardiovascular Disease

## 2019-06-08 VITALS — BP 122/74 | HR 77 | Temp 97.3°F | Ht 69.0 in | Wt 153.0 lb

## 2019-06-08 DIAGNOSIS — Z008 Encounter for other general examination: Secondary | ICD-10-CM | POA: Diagnosis not present

## 2019-06-08 NOTE — Patient Instructions (Signed)

## 2019-06-08 NOTE — Assessment & Plan Note (Signed)
History of hyperlipidemia intolerant to statin therapy currently on Zetia.  We did explore Repatha but the patient declined.  Her most recent lipid profile performed 04/08/2019 revealed total cholesterol 213, LDL 138 and HDL of 56.  This is improved from her lipid profile performed 03/24/2017 which time her LDL was 142.

## 2019-06-08 NOTE — Progress Notes (Signed)
06/08/2019 [name]   August 28, 1946  [id number]  Primary Physician Lawerance Cruel, MD Primary Cardiologist: Lorretta Harp MD Lupe Carney, [place]  HPI:  Shelby Peterson is a 73 y.o.  mildly overweight, married Caucasian female, mother of 1 child, grandmother to 1 step-grandchild who is self referred for cardiovascular evaluation.  I last saw her in the office 03/23/2018.  Her risk factor profile is positive for hyperlipidemia and family history. Her father had an MI at age 49. She is not diabetic, hypertensive nor does she smoke. She drinks occasional red wine. She has noticed increasing dyspnea on exertion and increasing fatigue as well as some back and shoulder pain.   Her past history otherwise is remarkable for having had breast cancer in 1999 and bilateral mastectomies, as well as an oophorectomy. She has a diagnosis of fibromyalgia and Sjogren syndrome. She has back pain and has had epidural injections by Dr. Veverly Fells and Dr. Nelva Bush. She also has interstitial cystitis followed by Dr. Lawrence Santiago. She has been tried on Zetia in the past with little benefit and was worried about starting on a statin because of all of her aches and pains.   I performed a Myoview stress test on her December 2013 because of chest pain which was normal except for breast attenuation artifact. She was statin intolerant to her Crestor.  She apparently is statin intolerant and did not tolerate either Crestor or simvastatin.  We had a discussion concerning PCSK9 monoclonal inhibitors.    She is on Zetia but declined Repatha.  Since I saw her a year ago she is remained stable.  She is still somewhat limited by orthopedic issues including scoliosis and does not exercise much.  She has altered her diet including sugar and carbohydrate intake.  Her LDL came down from 146 2 years ago to 130 on 04/08/2019.  She denies chest pain or shortness of breath.  Current Meds  Medication Sig  . acetaminophen  (TYLENOL) 650 MG CR tablet Take 650 mg by mouth every 8 (eight) hours as needed for pain.  Marland Kitchen acyclovir (ZOVIRAX) 400 MG tablet Take 400 mg by mouth 2 (two) times daily as needed.   . Cholecalciferol (VITAMIN D-3) 5000 UNITS TABS Take 1 tablet by mouth once a week.  . ezetimibe (ZETIA) 10 MG tablet Take 10 mg by mouth daily.  Marland Kitchen Fexofenadine HCl (ALLEGRA ALLERGY PO) Take by mouth.  . fish oil-omega-3 fatty acids 1000 MG capsule Take 1 g by mouth daily.  Marland Kitchen HYDROcodone-acetaminophen (NORCO/VICODIN) 5-325 MG per tablet Take 1 tablet by mouth every 6 (six) hours as needed for pain.  . Melatonin 5 MG TABS Take 5 mg by mouth at bedtime.  . meloxicam (MOBIC) 7.5 MG tablet Take 7.5 mg by mouth daily.  . Multiple Vitamins-Minerals (PRESERVISION AREDS PO) Take 1 tablet by mouth daily.  . traZODone (DESYREL) 100 MG tablet Take 100 mg by mouth at bedtime.  Marland Kitchen trimethoprim (TRIMPEX) 100 MG tablet Take 100 mg by mouth daily.  . [DISCONTINUED] amoxicillin (AMOXIL) 875 MG tablet Take 875 mg by mouth 2 (two) times daily.  . [DISCONTINUED] benzonatate (TESSALON) 200 MG capsule Take 200 mg by mouth 3 (three) times daily as needed for cough.  . [DISCONTINUED] cephALEXin (KEFLEX) 500 MG capsule Take 1 capsule (500 mg total) by mouth 3 (three) times daily.  . [DISCONTINUED] donepezil (ARICEPT) 10 MG tablet Take 1 tablet (10 mg total) by mouth at bedtime.  No Known Allergies  Social History   Socioeconomic History  . Marital status: Married    Spouse name: Not on file  . Number of children: 1  . Years of education: 73  . Highest education level: Not on file  Occupational History  . Occupation: Retired   Scientific laboratory technician  . Financial resource strain: Not on file  . Food insecurity    Worry: Not on file    Inability: Not on file  . Transportation needs    Medical: Not on file    Non-medical: Not on file  Tobacco Use  . Smoking status: Never Smoker  . Smokeless tobacco: Never Used  Substance and Sexual  Activity  . Alcohol use: Yes    Comment: 1 glass of wine a month   . Drug use: No  . Sexual activity: Not on file  Lifestyle  . Physical activity    Days per week: Not on file    Minutes per session: Not on file  . Stress: Not on file  Relationships  . Social Herbalist on phone: Not on file    Gets together: Not on file    Attends religious service: Not on file    Active member of club or organization: Not on file    Attends meetings of clubs or organizations: Not on file    Relationship status: Not on file  . Intimate partner violence    Fear of current or ex partner: Not on file    Emotionally abused: Not on file    Physically abused: Not on file    Forced sexual activity: Not on file  Other Topics Concern  . Not on file  Social History Narrative   Drinks 2 cups of coffee a day      Review of Systems: General: negative for chills, fever, night sweats or weight changes.  Cardiovascular: negative for chest pain, dyspnea on exertion, edema, orthopnea, palpitations, paroxysmal nocturnal dyspnea or shortness of breath Dermatological: negative for rash Respiratory: negative for cough or wheezing Urologic: negative for hematuria Abdominal: negative for nausea, vomiting, diarrhea, bright red blood per rectum, melena, or hematemesis Neurologic: negative for visual changes, syncope, or dizziness All other systems reviewed and are otherwise negative except as noted above.    Blood pressure 122/74, pulse 77, temperature (!) 97.3 F (36.3 C), height 5\' 9"  (1.753 m), weight 153 lb (69.4 kg).  General appearance: alert and no distress Neck: no adenopathy, no carotid bruit, no JVD, supple, symmetrical, trachea midline and thyroid not enlarged, symmetric, no tenderness/mass/nodules Lungs: clear to auscultation bilaterally Heart: regular rate and rhythm, S1, S2 normal, no murmur, click, rub or gallop Extremities: extremities normal, atraumatic, no cyanosis or edema Pulses:  2+ and symmetric Skin: Skin color, texture, turgor normal. No rashes or lesions Neurologic: Alert and oriented X 3, normal strength and tone. Normal symmetric reflexes. Normal coordination and gait  EKG sinus rhythm at 77 without ST or T wave changes.  I personally reviewed this EKG.  ASSESSMENT AND PLAN:   Hyperlipidemia History of hyperlipidemia intolerant to statin therapy currently on Zetia.  We did explore Repatha but the patient declined.  Her most recent lipid profile performed 04/08/2019 revealed total cholesterol 213, LDL 138 and HDL of 56.  This is improved from her lipid profile performed 03/24/2017 which time her LDL was 142.      Lorretta Harp MD FACP,FACC,FAHA, [facility] 06/08/2019 11:08 AM

## 2019-08-01 ENCOUNTER — Other Ambulatory Visit: Payer: Self-pay

## 2019-08-01 ENCOUNTER — Ambulatory Visit
Admission: RE | Admit: 2019-08-01 | Discharge: 2019-08-01 | Disposition: A | Discharge status: Home / Self Care | Payer: Medicare Other | Source: Ambulatory Visit | Attending: Family Medicine | Admitting: Family Medicine

## 2019-08-01 ENCOUNTER — Other Ambulatory Visit: Payer: Medicare Other

## 2019-08-01 DIAGNOSIS — M810 Age-related osteoporosis without current pathological fracture: Secondary | ICD-10-CM

## 2019-11-27 ENCOUNTER — Ambulatory Visit: Payer: Medicare PPO | Attending: Internal Medicine

## 2019-12-12 ENCOUNTER — Ambulatory Visit: Payer: Medicare Other

## 2019-12-21 DIAGNOSIS — M9903 Segmental and somatic dysfunction of lumbar region: Secondary | ICD-10-CM | POA: Diagnosis not present

## 2019-12-21 DIAGNOSIS — M7912 Myalgia of auxiliary muscles, head and neck: Secondary | ICD-10-CM | POA: Diagnosis not present

## 2019-12-21 DIAGNOSIS — M9901 Segmental and somatic dysfunction of cervical region: Secondary | ICD-10-CM | POA: Diagnosis not present

## 2019-12-21 DIAGNOSIS — M461 Sacroiliitis, not elsewhere classified: Secondary | ICD-10-CM | POA: Diagnosis not present

## 2019-12-21 DIAGNOSIS — M9905 Segmental and somatic dysfunction of pelvic region: Secondary | ICD-10-CM | POA: Diagnosis not present

## 2019-12-21 DIAGNOSIS — M5136 Other intervertebral disc degeneration, lumbar region: Secondary | ICD-10-CM | POA: Diagnosis not present

## 2019-12-21 DIAGNOSIS — M9902 Segmental and somatic dysfunction of thoracic region: Secondary | ICD-10-CM | POA: Diagnosis not present

## 2019-12-21 DIAGNOSIS — M47813 Spondylosis without myelopathy or radiculopathy, cervicothoracic region: Secondary | ICD-10-CM | POA: Diagnosis not present

## 2019-12-22 DIAGNOSIS — M797 Fibromyalgia: Secondary | ICD-10-CM | POA: Diagnosis not present

## 2019-12-22 DIAGNOSIS — M150 Primary generalized (osteo)arthritis: Secondary | ICD-10-CM | POA: Diagnosis not present

## 2019-12-22 DIAGNOSIS — M3501 Sicca syndrome with keratoconjunctivitis: Secondary | ICD-10-CM | POA: Diagnosis not present

## 2019-12-27 DIAGNOSIS — M9905 Segmental and somatic dysfunction of pelvic region: Secondary | ICD-10-CM | POA: Diagnosis not present

## 2019-12-27 DIAGNOSIS — M9902 Segmental and somatic dysfunction of thoracic region: Secondary | ICD-10-CM | POA: Diagnosis not present

## 2019-12-27 DIAGNOSIS — M9903 Segmental and somatic dysfunction of lumbar region: Secondary | ICD-10-CM | POA: Diagnosis not present

## 2019-12-27 DIAGNOSIS — M7912 Myalgia of auxiliary muscles, head and neck: Secondary | ICD-10-CM | POA: Diagnosis not present

## 2019-12-27 DIAGNOSIS — M9901 Segmental and somatic dysfunction of cervical region: Secondary | ICD-10-CM | POA: Diagnosis not present

## 2019-12-27 DIAGNOSIS — M5136 Other intervertebral disc degeneration, lumbar region: Secondary | ICD-10-CM | POA: Diagnosis not present

## 2019-12-27 DIAGNOSIS — M47813 Spondylosis without myelopathy or radiculopathy, cervicothoracic region: Secondary | ICD-10-CM | POA: Diagnosis not present

## 2019-12-27 DIAGNOSIS — M461 Sacroiliitis, not elsewhere classified: Secondary | ICD-10-CM | POA: Diagnosis not present

## 2019-12-29 DIAGNOSIS — M47813 Spondylosis without myelopathy or radiculopathy, cervicothoracic region: Secondary | ICD-10-CM | POA: Diagnosis not present

## 2019-12-29 DIAGNOSIS — M9902 Segmental and somatic dysfunction of thoracic region: Secondary | ICD-10-CM | POA: Diagnosis not present

## 2019-12-29 DIAGNOSIS — M5136 Other intervertebral disc degeneration, lumbar region: Secondary | ICD-10-CM | POA: Diagnosis not present

## 2019-12-29 DIAGNOSIS — M461 Sacroiliitis, not elsewhere classified: Secondary | ICD-10-CM | POA: Diagnosis not present

## 2019-12-29 DIAGNOSIS — M9901 Segmental and somatic dysfunction of cervical region: Secondary | ICD-10-CM | POA: Diagnosis not present

## 2019-12-29 DIAGNOSIS — M9905 Segmental and somatic dysfunction of pelvic region: Secondary | ICD-10-CM | POA: Diagnosis not present

## 2019-12-29 DIAGNOSIS — M7912 Myalgia of auxiliary muscles, head and neck: Secondary | ICD-10-CM | POA: Diagnosis not present

## 2019-12-29 DIAGNOSIS — M9903 Segmental and somatic dysfunction of lumbar region: Secondary | ICD-10-CM | POA: Diagnosis not present

## 2020-01-03 DIAGNOSIS — M47813 Spondylosis without myelopathy or radiculopathy, cervicothoracic region: Secondary | ICD-10-CM | POA: Diagnosis not present

## 2020-01-03 DIAGNOSIS — M9901 Segmental and somatic dysfunction of cervical region: Secondary | ICD-10-CM | POA: Diagnosis not present

## 2020-01-03 DIAGNOSIS — M461 Sacroiliitis, not elsewhere classified: Secondary | ICD-10-CM | POA: Diagnosis not present

## 2020-01-03 DIAGNOSIS — M9905 Segmental and somatic dysfunction of pelvic region: Secondary | ICD-10-CM | POA: Diagnosis not present

## 2020-01-03 DIAGNOSIS — M9903 Segmental and somatic dysfunction of lumbar region: Secondary | ICD-10-CM | POA: Diagnosis not present

## 2020-01-03 DIAGNOSIS — M7912 Myalgia of auxiliary muscles, head and neck: Secondary | ICD-10-CM | POA: Diagnosis not present

## 2020-01-03 DIAGNOSIS — M9902 Segmental and somatic dysfunction of thoracic region: Secondary | ICD-10-CM | POA: Diagnosis not present

## 2020-01-03 DIAGNOSIS — M5136 Other intervertebral disc degeneration, lumbar region: Secondary | ICD-10-CM | POA: Diagnosis not present

## 2020-01-12 DIAGNOSIS — M9901 Segmental and somatic dysfunction of cervical region: Secondary | ICD-10-CM | POA: Diagnosis not present

## 2020-01-12 DIAGNOSIS — M461 Sacroiliitis, not elsewhere classified: Secondary | ICD-10-CM | POA: Diagnosis not present

## 2020-01-12 DIAGNOSIS — M47813 Spondylosis without myelopathy or radiculopathy, cervicothoracic region: Secondary | ICD-10-CM | POA: Diagnosis not present

## 2020-01-12 DIAGNOSIS — M9902 Segmental and somatic dysfunction of thoracic region: Secondary | ICD-10-CM | POA: Diagnosis not present

## 2020-01-12 DIAGNOSIS — M9903 Segmental and somatic dysfunction of lumbar region: Secondary | ICD-10-CM | POA: Diagnosis not present

## 2020-01-12 DIAGNOSIS — M7912 Myalgia of auxiliary muscles, head and neck: Secondary | ICD-10-CM | POA: Diagnosis not present

## 2020-01-12 DIAGNOSIS — M9905 Segmental and somatic dysfunction of pelvic region: Secondary | ICD-10-CM | POA: Diagnosis not present

## 2020-01-12 DIAGNOSIS — M5136 Other intervertebral disc degeneration, lumbar region: Secondary | ICD-10-CM | POA: Diagnosis not present

## 2020-01-17 DIAGNOSIS — M461 Sacroiliitis, not elsewhere classified: Secondary | ICD-10-CM | POA: Diagnosis not present

## 2020-01-17 DIAGNOSIS — M47813 Spondylosis without myelopathy or radiculopathy, cervicothoracic region: Secondary | ICD-10-CM | POA: Diagnosis not present

## 2020-01-17 DIAGNOSIS — M5136 Other intervertebral disc degeneration, lumbar region: Secondary | ICD-10-CM | POA: Diagnosis not present

## 2020-01-17 DIAGNOSIS — M9903 Segmental and somatic dysfunction of lumbar region: Secondary | ICD-10-CM | POA: Diagnosis not present

## 2020-01-17 DIAGNOSIS — M9902 Segmental and somatic dysfunction of thoracic region: Secondary | ICD-10-CM | POA: Diagnosis not present

## 2020-01-17 DIAGNOSIS — M7912 Myalgia of auxiliary muscles, head and neck: Secondary | ICD-10-CM | POA: Diagnosis not present

## 2020-01-17 DIAGNOSIS — M9905 Segmental and somatic dysfunction of pelvic region: Secondary | ICD-10-CM | POA: Diagnosis not present

## 2020-01-17 DIAGNOSIS — M9901 Segmental and somatic dysfunction of cervical region: Secondary | ICD-10-CM | POA: Diagnosis not present

## 2020-01-19 DIAGNOSIS — M3500 Sicca syndrome, unspecified: Secondary | ICD-10-CM | POA: Diagnosis not present

## 2020-01-19 DIAGNOSIS — H25813 Combined forms of age-related cataract, bilateral: Secondary | ICD-10-CM | POA: Diagnosis not present

## 2020-01-19 DIAGNOSIS — H04123 Dry eye syndrome of bilateral lacrimal glands: Secondary | ICD-10-CM | POA: Diagnosis not present

## 2020-01-19 DIAGNOSIS — H353131 Nonexudative age-related macular degeneration, bilateral, early dry stage: Secondary | ICD-10-CM | POA: Diagnosis not present

## 2020-02-01 DIAGNOSIS — M5136 Other intervertebral disc degeneration, lumbar region: Secondary | ICD-10-CM | POA: Diagnosis not present

## 2020-02-01 DIAGNOSIS — M9902 Segmental and somatic dysfunction of thoracic region: Secondary | ICD-10-CM | POA: Diagnosis not present

## 2020-02-01 DIAGNOSIS — M9905 Segmental and somatic dysfunction of pelvic region: Secondary | ICD-10-CM | POA: Diagnosis not present

## 2020-02-01 DIAGNOSIS — M7912 Myalgia of auxiliary muscles, head and neck: Secondary | ICD-10-CM | POA: Diagnosis not present

## 2020-02-01 DIAGNOSIS — M47813 Spondylosis without myelopathy or radiculopathy, cervicothoracic region: Secondary | ICD-10-CM | POA: Diagnosis not present

## 2020-02-01 DIAGNOSIS — M9901 Segmental and somatic dysfunction of cervical region: Secondary | ICD-10-CM | POA: Diagnosis not present

## 2020-02-01 DIAGNOSIS — M461 Sacroiliitis, not elsewhere classified: Secondary | ICD-10-CM | POA: Diagnosis not present

## 2020-02-01 DIAGNOSIS — M9903 Segmental and somatic dysfunction of lumbar region: Secondary | ICD-10-CM | POA: Diagnosis not present

## 2020-02-02 DIAGNOSIS — M5416 Radiculopathy, lumbar region: Secondary | ICD-10-CM | POA: Diagnosis not present

## 2020-02-02 DIAGNOSIS — M62830 Muscle spasm of back: Secondary | ICD-10-CM | POA: Diagnosis not present

## 2020-02-02 DIAGNOSIS — Z79891 Long term (current) use of opiate analgesic: Secondary | ICD-10-CM | POA: Diagnosis not present

## 2020-02-02 DIAGNOSIS — G894 Chronic pain syndrome: Secondary | ICD-10-CM | POA: Diagnosis not present

## 2020-02-02 DIAGNOSIS — M5136 Other intervertebral disc degeneration, lumbar region: Secondary | ICD-10-CM | POA: Diagnosis not present

## 2020-02-08 DIAGNOSIS — M9902 Segmental and somatic dysfunction of thoracic region: Secondary | ICD-10-CM | POA: Diagnosis not present

## 2020-02-08 DIAGNOSIS — M9905 Segmental and somatic dysfunction of pelvic region: Secondary | ICD-10-CM | POA: Diagnosis not present

## 2020-02-08 DIAGNOSIS — M5136 Other intervertebral disc degeneration, lumbar region: Secondary | ICD-10-CM | POA: Diagnosis not present

## 2020-02-08 DIAGNOSIS — M9901 Segmental and somatic dysfunction of cervical region: Secondary | ICD-10-CM | POA: Diagnosis not present

## 2020-02-08 DIAGNOSIS — M461 Sacroiliitis, not elsewhere classified: Secondary | ICD-10-CM | POA: Diagnosis not present

## 2020-02-08 DIAGNOSIS — M9903 Segmental and somatic dysfunction of lumbar region: Secondary | ICD-10-CM | POA: Diagnosis not present

## 2020-02-08 DIAGNOSIS — M7912 Myalgia of auxiliary muscles, head and neck: Secondary | ICD-10-CM | POA: Diagnosis not present

## 2020-02-08 DIAGNOSIS — M47813 Spondylosis without myelopathy or radiculopathy, cervicothoracic region: Secondary | ICD-10-CM | POA: Diagnosis not present

## 2020-02-16 DIAGNOSIS — M9905 Segmental and somatic dysfunction of pelvic region: Secondary | ICD-10-CM | POA: Diagnosis not present

## 2020-02-16 DIAGNOSIS — M7912 Myalgia of auxiliary muscles, head and neck: Secondary | ICD-10-CM | POA: Diagnosis not present

## 2020-02-16 DIAGNOSIS — M461 Sacroiliitis, not elsewhere classified: Secondary | ICD-10-CM | POA: Diagnosis not present

## 2020-02-16 DIAGNOSIS — M9901 Segmental and somatic dysfunction of cervical region: Secondary | ICD-10-CM | POA: Diagnosis not present

## 2020-02-16 DIAGNOSIS — M47813 Spondylosis without myelopathy or radiculopathy, cervicothoracic region: Secondary | ICD-10-CM | POA: Diagnosis not present

## 2020-02-16 DIAGNOSIS — M9902 Segmental and somatic dysfunction of thoracic region: Secondary | ICD-10-CM | POA: Diagnosis not present

## 2020-02-16 DIAGNOSIS — M5136 Other intervertebral disc degeneration, lumbar region: Secondary | ICD-10-CM | POA: Diagnosis not present

## 2020-02-16 DIAGNOSIS — M9903 Segmental and somatic dysfunction of lumbar region: Secondary | ICD-10-CM | POA: Diagnosis not present

## 2020-02-27 DIAGNOSIS — M9901 Segmental and somatic dysfunction of cervical region: Secondary | ICD-10-CM | POA: Diagnosis not present

## 2020-02-27 DIAGNOSIS — M7912 Myalgia of auxiliary muscles, head and neck: Secondary | ICD-10-CM | POA: Diagnosis not present

## 2020-02-27 DIAGNOSIS — M461 Sacroiliitis, not elsewhere classified: Secondary | ICD-10-CM | POA: Diagnosis not present

## 2020-02-27 DIAGNOSIS — M47813 Spondylosis without myelopathy or radiculopathy, cervicothoracic region: Secondary | ICD-10-CM | POA: Diagnosis not present

## 2020-02-27 DIAGNOSIS — M5136 Other intervertebral disc degeneration, lumbar region: Secondary | ICD-10-CM | POA: Diagnosis not present

## 2020-02-27 DIAGNOSIS — M9903 Segmental and somatic dysfunction of lumbar region: Secondary | ICD-10-CM | POA: Diagnosis not present

## 2020-02-27 DIAGNOSIS — M9905 Segmental and somatic dysfunction of pelvic region: Secondary | ICD-10-CM | POA: Diagnosis not present

## 2020-02-27 DIAGNOSIS — M9902 Segmental and somatic dysfunction of thoracic region: Secondary | ICD-10-CM | POA: Diagnosis not present

## 2020-02-29 DIAGNOSIS — Z1152 Encounter for screening for COVID-19: Secondary | ICD-10-CM | POA: Diagnosis not present

## 2020-02-29 DIAGNOSIS — Z79899 Other long term (current) drug therapy: Secondary | ICD-10-CM | POA: Diagnosis not present

## 2020-02-29 DIAGNOSIS — M791 Myalgia, unspecified site: Secondary | ICD-10-CM | POA: Diagnosis not present

## 2020-02-29 DIAGNOSIS — R7303 Prediabetes: Secondary | ICD-10-CM | POA: Diagnosis not present

## 2020-03-05 DIAGNOSIS — H52201 Unspecified astigmatism, right eye: Secondary | ICD-10-CM | POA: Diagnosis not present

## 2020-03-05 DIAGNOSIS — H25811 Combined forms of age-related cataract, right eye: Secondary | ICD-10-CM | POA: Diagnosis not present

## 2020-03-05 DIAGNOSIS — H269 Unspecified cataract: Secondary | ICD-10-CM | POA: Diagnosis not present

## 2020-03-06 DIAGNOSIS — H25812 Combined forms of age-related cataract, left eye: Secondary | ICD-10-CM | POA: Diagnosis not present

## 2020-03-26 DIAGNOSIS — H25812 Combined forms of age-related cataract, left eye: Secondary | ICD-10-CM | POA: Diagnosis not present

## 2020-03-26 DIAGNOSIS — H269 Unspecified cataract: Secondary | ICD-10-CM | POA: Diagnosis not present

## 2020-03-26 DIAGNOSIS — H52202 Unspecified astigmatism, left eye: Secondary | ICD-10-CM | POA: Diagnosis not present

## 2020-06-28 DIAGNOSIS — L400 Psoriasis vulgaris: Secondary | ICD-10-CM | POA: Diagnosis not present

## 2020-06-28 DIAGNOSIS — M6740 Ganglion, unspecified site: Secondary | ICD-10-CM | POA: Diagnosis not present

## 2020-07-03 DIAGNOSIS — M3500 Sicca syndrome, unspecified: Secondary | ICD-10-CM | POA: Diagnosis not present

## 2020-07-03 DIAGNOSIS — R7309 Other abnormal glucose: Secondary | ICD-10-CM | POA: Diagnosis not present

## 2020-07-03 DIAGNOSIS — M797 Fibromyalgia: Secondary | ICD-10-CM | POA: Diagnosis not present

## 2020-07-03 DIAGNOSIS — Z853 Personal history of malignant neoplasm of breast: Secondary | ICD-10-CM | POA: Diagnosis not present

## 2020-07-04 DIAGNOSIS — M9902 Segmental and somatic dysfunction of thoracic region: Secondary | ICD-10-CM | POA: Diagnosis not present

## 2020-07-04 DIAGNOSIS — M9901 Segmental and somatic dysfunction of cervical region: Secondary | ICD-10-CM | POA: Diagnosis not present

## 2020-07-04 DIAGNOSIS — M461 Sacroiliitis, not elsewhere classified: Secondary | ICD-10-CM | POA: Diagnosis not present

## 2020-07-04 DIAGNOSIS — M9905 Segmental and somatic dysfunction of pelvic region: Secondary | ICD-10-CM | POA: Diagnosis not present

## 2020-07-04 DIAGNOSIS — M5136 Other intervertebral disc degeneration, lumbar region: Secondary | ICD-10-CM | POA: Diagnosis not present

## 2020-07-04 DIAGNOSIS — M47813 Spondylosis without myelopathy or radiculopathy, cervicothoracic region: Secondary | ICD-10-CM | POA: Diagnosis not present

## 2020-07-04 DIAGNOSIS — M7912 Myalgia of auxiliary muscles, head and neck: Secondary | ICD-10-CM | POA: Diagnosis not present

## 2020-07-04 DIAGNOSIS — M9903 Segmental and somatic dysfunction of lumbar region: Secondary | ICD-10-CM | POA: Diagnosis not present

## 2020-07-11 DIAGNOSIS — M47813 Spondylosis without myelopathy or radiculopathy, cervicothoracic region: Secondary | ICD-10-CM | POA: Diagnosis not present

## 2020-07-11 DIAGNOSIS — M9905 Segmental and somatic dysfunction of pelvic region: Secondary | ICD-10-CM | POA: Diagnosis not present

## 2020-07-11 DIAGNOSIS — M461 Sacroiliitis, not elsewhere classified: Secondary | ICD-10-CM | POA: Diagnosis not present

## 2020-07-11 DIAGNOSIS — M7912 Myalgia of auxiliary muscles, head and neck: Secondary | ICD-10-CM | POA: Diagnosis not present

## 2020-07-11 DIAGNOSIS — M9902 Segmental and somatic dysfunction of thoracic region: Secondary | ICD-10-CM | POA: Diagnosis not present

## 2020-07-11 DIAGNOSIS — M9903 Segmental and somatic dysfunction of lumbar region: Secondary | ICD-10-CM | POA: Diagnosis not present

## 2020-07-11 DIAGNOSIS — M5136 Other intervertebral disc degeneration, lumbar region: Secondary | ICD-10-CM | POA: Diagnosis not present

## 2020-07-11 DIAGNOSIS — M9901 Segmental and somatic dysfunction of cervical region: Secondary | ICD-10-CM | POA: Diagnosis not present

## 2020-07-19 DIAGNOSIS — M9903 Segmental and somatic dysfunction of lumbar region: Secondary | ICD-10-CM | POA: Diagnosis not present

## 2020-07-19 DIAGNOSIS — M9901 Segmental and somatic dysfunction of cervical region: Secondary | ICD-10-CM | POA: Diagnosis not present

## 2020-07-19 DIAGNOSIS — M9905 Segmental and somatic dysfunction of pelvic region: Secondary | ICD-10-CM | POA: Diagnosis not present

## 2020-07-19 DIAGNOSIS — M461 Sacroiliitis, not elsewhere classified: Secondary | ICD-10-CM | POA: Diagnosis not present

## 2020-07-19 DIAGNOSIS — M5136 Other intervertebral disc degeneration, lumbar region: Secondary | ICD-10-CM | POA: Diagnosis not present

## 2020-07-19 DIAGNOSIS — M9902 Segmental and somatic dysfunction of thoracic region: Secondary | ICD-10-CM | POA: Diagnosis not present

## 2020-07-19 DIAGNOSIS — M7912 Myalgia of auxiliary muscles, head and neck: Secondary | ICD-10-CM | POA: Diagnosis not present

## 2020-07-19 DIAGNOSIS — M47813 Spondylosis without myelopathy or radiculopathy, cervicothoracic region: Secondary | ICD-10-CM | POA: Diagnosis not present

## 2020-08-01 DIAGNOSIS — M47813 Spondylosis without myelopathy or radiculopathy, cervicothoracic region: Secondary | ICD-10-CM | POA: Diagnosis not present

## 2020-08-01 DIAGNOSIS — M9901 Segmental and somatic dysfunction of cervical region: Secondary | ICD-10-CM | POA: Diagnosis not present

## 2020-08-01 DIAGNOSIS — M7912 Myalgia of auxiliary muscles, head and neck: Secondary | ICD-10-CM | POA: Diagnosis not present

## 2020-08-01 DIAGNOSIS — M9903 Segmental and somatic dysfunction of lumbar region: Secondary | ICD-10-CM | POA: Diagnosis not present

## 2020-08-01 DIAGNOSIS — M461 Sacroiliitis, not elsewhere classified: Secondary | ICD-10-CM | POA: Diagnosis not present

## 2020-08-01 DIAGNOSIS — M9902 Segmental and somatic dysfunction of thoracic region: Secondary | ICD-10-CM | POA: Diagnosis not present

## 2020-08-01 DIAGNOSIS — M5136 Other intervertebral disc degeneration, lumbar region: Secondary | ICD-10-CM | POA: Diagnosis not present

## 2020-08-01 DIAGNOSIS — M9905 Segmental and somatic dysfunction of pelvic region: Secondary | ICD-10-CM | POA: Diagnosis not present

## 2020-08-08 DIAGNOSIS — M47813 Spondylosis without myelopathy or radiculopathy, cervicothoracic region: Secondary | ICD-10-CM | POA: Diagnosis not present

## 2020-08-08 DIAGNOSIS — M9901 Segmental and somatic dysfunction of cervical region: Secondary | ICD-10-CM | POA: Diagnosis not present

## 2020-08-08 DIAGNOSIS — M9903 Segmental and somatic dysfunction of lumbar region: Secondary | ICD-10-CM | POA: Diagnosis not present

## 2020-08-08 DIAGNOSIS — M461 Sacroiliitis, not elsewhere classified: Secondary | ICD-10-CM | POA: Diagnosis not present

## 2020-08-08 DIAGNOSIS — M9905 Segmental and somatic dysfunction of pelvic region: Secondary | ICD-10-CM | POA: Diagnosis not present

## 2020-08-08 DIAGNOSIS — M5136 Other intervertebral disc degeneration, lumbar region: Secondary | ICD-10-CM | POA: Diagnosis not present

## 2020-08-08 DIAGNOSIS — M7912 Myalgia of auxiliary muscles, head and neck: Secondary | ICD-10-CM | POA: Diagnosis not present

## 2020-08-08 DIAGNOSIS — M9902 Segmental and somatic dysfunction of thoracic region: Secondary | ICD-10-CM | POA: Diagnosis not present

## 2020-08-14 DIAGNOSIS — R634 Abnormal weight loss: Secondary | ICD-10-CM | POA: Diagnosis not present

## 2020-08-14 DIAGNOSIS — R7303 Prediabetes: Secondary | ICD-10-CM | POA: Diagnosis not present

## 2020-08-14 DIAGNOSIS — G479 Sleep disorder, unspecified: Secondary | ICD-10-CM | POA: Diagnosis not present

## 2020-08-14 DIAGNOSIS — F411 Generalized anxiety disorder: Secondary | ICD-10-CM | POA: Diagnosis not present

## 2020-08-16 DIAGNOSIS — M7912 Myalgia of auxiliary muscles, head and neck: Secondary | ICD-10-CM | POA: Diagnosis not present

## 2020-08-16 DIAGNOSIS — M9903 Segmental and somatic dysfunction of lumbar region: Secondary | ICD-10-CM | POA: Diagnosis not present

## 2020-08-16 DIAGNOSIS — M9905 Segmental and somatic dysfunction of pelvic region: Secondary | ICD-10-CM | POA: Diagnosis not present

## 2020-08-16 DIAGNOSIS — M5136 Other intervertebral disc degeneration, lumbar region: Secondary | ICD-10-CM | POA: Diagnosis not present

## 2020-08-16 DIAGNOSIS — M47813 Spondylosis without myelopathy or radiculopathy, cervicothoracic region: Secondary | ICD-10-CM | POA: Diagnosis not present

## 2020-08-16 DIAGNOSIS — M9901 Segmental and somatic dysfunction of cervical region: Secondary | ICD-10-CM | POA: Diagnosis not present

## 2020-08-16 DIAGNOSIS — M9902 Segmental and somatic dysfunction of thoracic region: Secondary | ICD-10-CM | POA: Diagnosis not present

## 2020-08-16 DIAGNOSIS — M461 Sacroiliitis, not elsewhere classified: Secondary | ICD-10-CM | POA: Diagnosis not present

## 2020-08-29 DIAGNOSIS — Z79891 Long term (current) use of opiate analgesic: Secondary | ICD-10-CM | POA: Diagnosis not present

## 2020-08-29 DIAGNOSIS — M5136 Other intervertebral disc degeneration, lumbar region: Secondary | ICD-10-CM | POA: Diagnosis not present

## 2020-08-29 DIAGNOSIS — G894 Chronic pain syndrome: Secondary | ICD-10-CM | POA: Diagnosis not present

## 2020-09-08 DIAGNOSIS — M542 Cervicalgia: Secondary | ICD-10-CM | POA: Diagnosis not present

## 2020-09-20 DIAGNOSIS — M5136 Other intervertebral disc degeneration, lumbar region: Secondary | ICD-10-CM | POA: Diagnosis not present

## 2020-10-08 DIAGNOSIS — M542 Cervicalgia: Secondary | ICD-10-CM | POA: Diagnosis not present

## 2020-10-24 DIAGNOSIS — R634 Abnormal weight loss: Secondary | ICD-10-CM | POA: Diagnosis not present

## 2020-10-24 DIAGNOSIS — R7303 Prediabetes: Secondary | ICD-10-CM | POA: Diagnosis not present

## 2020-10-29 ENCOUNTER — Other Ambulatory Visit: Payer: Self-pay | Admitting: Family Medicine

## 2020-10-29 DIAGNOSIS — R634 Abnormal weight loss: Secondary | ICD-10-CM

## 2020-11-02 ENCOUNTER — Other Ambulatory Visit: Payer: Self-pay

## 2020-11-02 ENCOUNTER — Ambulatory Visit
Admission: RE | Admit: 2020-11-02 | Discharge: 2020-11-02 | Disposition: A | Discharge status: Home / Self Care | Payer: Medicare PPO | Source: Ambulatory Visit | Attending: Family Medicine | Admitting: Family Medicine

## 2020-11-02 DIAGNOSIS — Q433 Congenital malformations of intestinal fixation: Secondary | ICD-10-CM | POA: Diagnosis not present

## 2020-11-02 DIAGNOSIS — I700 Atherosclerosis of aorta: Secondary | ICD-10-CM | POA: Diagnosis not present

## 2020-11-02 DIAGNOSIS — R634 Abnormal weight loss: Secondary | ICD-10-CM

## 2020-11-02 DIAGNOSIS — M419 Scoliosis, unspecified: Secondary | ICD-10-CM | POA: Diagnosis not present

## 2020-11-02 MED ORDER — IOPAMIDOL (ISOVUE-300) INJECTION 61%
100.0000 mL | Freq: Once | INTRAVENOUS | Status: AC | PRN
  Administered 2020-11-02: 100 mL via INTRAVENOUS

## 2020-11-07 ENCOUNTER — Other Ambulatory Visit (HOSPITAL_COMMUNITY): Payer: Self-pay | Admitting: Family Medicine

## 2020-11-07 ENCOUNTER — Other Ambulatory Visit: Payer: Self-pay | Admitting: Family Medicine

## 2020-11-07 DIAGNOSIS — R935 Abnormal findings on diagnostic imaging of other abdominal regions, including retroperitoneum: Secondary | ICD-10-CM

## 2020-11-08 ENCOUNTER — Telehealth: Payer: Self-pay | Admitting: Hematology and Oncology

## 2020-11-08 DIAGNOSIS — M542 Cervicalgia: Secondary | ICD-10-CM | POA: Diagnosis not present

## 2020-11-08 NOTE — Telephone Encounter (Signed)
Received a new pt referral from Dr. Harrington Challenger for bone mets on CT bone scan w/pesonal hx of brca. Pt has been cld and scheduled to see Dr. Lindi Adie on 1/21 at 2pm. Pt aware to arrive 20 minutes early.

## 2020-11-08 NOTE — Progress Notes (Signed)
[facility] CONSULT NOTE  Patient Care Team: Lawerance Cruel, MD as PCP - General (Family Medicine)  CHIEF COMPLAINTS/PURPOSE OF CONSULTATION:  Newly diagnosed findings suspicious for metastatic breast cancer  HISTORY OF PRESENTING ILLNESS:  Shelby Peterson 75 y.o. female is here because of recent diagnosis of metastatic breast cancer. She has a history of breast cancer treated with bilateral mastectomy and BRCA mutation. Patient was experiencing unintentional weight loss. CT abdomen/pelvis on 11/02/20 showed heterogeneous bone mineralization throughout the visible spine and pelvis, possibly representing osseous metastatic disease. She presents to the clinic today for initial evaluation and discussion of treatment options.  Her original breast cancer was in 1999 she was diagnosed stage I ER/PR positive breast cancer treated by Dr. Jana Hakim with bilateral mastectomies and reconstruction. Couple of years ago she changed her diet and started eating better and started to lose weight.  Over the past year she lost almost 30 pounds.  This was felt to be related to metformin but also she got concerned because of continued weight loss and her primary care physician ordered the CT scans for further evaluation.  She has had chronic low back pains for which she has received previous injections and previous MRIs were done her last one was in 2019 which was negative for metastatic disease. There was heterogeneous bone signal in the CT scan and this prompted additional testing with a bone scan which is scheduled for next Thursday.  She was sent to [place] for discussion regarding further work-up and treatment options.  I reviewed her records extensively and collaborated the history with the patient.  SUMMARY OF ONCOLOGIC HISTORY: Oncology History   No history exists.    MEDICAL HISTORY:  Past Medical History:  Diagnosis Date  . Cancer ([place])   . Dyspnea    2D ECHO, 10/18/2012 - EF-60-65%,  normal  . Family history of early CAD    LEXISCAN, 10/19/2012 - normal  . Fibromyalgia   . Hyperlipidemia   . Immune disorder ([place])   . Osteoporosis   . Sjoegren syndrome   . Visual disorder     SURGICAL HISTORY: Past Surgical History:  Procedure Laterality Date  . MASTECTOMY Bilateral   . TUMOR REMOVAL     Benign on L ovary     SOCIAL HISTORY: Social History   Socioeconomic History  . Marital status: Married    Spouse name: Not on file  . Number of children: 1  . Years of education: 49  . Highest education level: Not on file  Occupational History  . Occupation: Retired   Tobacco Use  . Smoking status: Never Smoker  . Smokeless tobacco: Never Used  Substance and Sexual Activity  . Alcohol use: Yes    Comment: 1 glass of wine a month   . Drug use: No  . Sexual activity: Not on file  Other Topics Concern  . Not on file  Social History Narrative   Drinks 2 cups of coffee a day    Social Determinants of Health   Financial Resource Strain: Not on file  Food Insecurity: Not on file  Transportation Needs: Not on file  Physical Activity: Not on file  Stress: Not on file  Social Connections: Not on file  Intimate Partner Violence: Not on file    FAMILY HISTORY: Family History  Problem Relation Age of Onset  . Dementia Mother   . Cancer Mother        Breast cancer  . Diabetes Father   .  Hyperlipidemia Father   . Heart disease Father   . Stroke Maternal Grandfather   . Hypertension Maternal Grandfather   . Diabetes Paternal Grandmother   . Cancer Paternal Grandmother        Ovarian cancer  . Cancer Paternal Grandfather        Prostate cancer  . Sleep apnea Child     ALLERGIES:  has No Known Allergies.  MEDICATIONS:  Current Outpatient Medications  Medication Sig Dispense Refill  . HYDROcodone-Acetaminophen 10-325 MG/15ML SOLN Take 0.5 tablets by mouth daily as needed.  0  . acetaminophen (TYLENOL) 650 MG CR tablet Take 650 mg by mouth every 8  (eight) hours as needed for pain.    Marland Kitchen acyclovir (ZOVIRAX) 400 MG tablet Take 400 mg by mouth 2 (two) times daily as needed.     . Cholecalciferol (VITAMIN D-3) 5000 UNITS TABS Take 1 tablet by mouth once a week.    Marland Kitchen Fexofenadine HCl (ALLEGRA ALLERGY PO) Take by mouth.    . fish oil-omega-3 fatty acids 1000 MG capsule Take 1 g by mouth daily.    . Melatonin 5 MG TABS Take 5 mg by mouth at bedtime.    . Multiple Vitamins-Minerals (PRESERVISION AREDS PO) Take 1 tablet by mouth daily.    . traZODone (DESYREL) 100 MG tablet Take 100 mg by mouth at bedtime.    Marland Kitchen trimethoprim (TRIMPEX) 100 MG tablet Take 100 mg by mouth daily.     No current facility-administered medications for this visit.    REVIEW OF SYSTEMS:   Constitutional: Profound weight loss 30 pounds in the last year All other systems were reviewed with the patient and are negative.  PHYSICAL EXAMINATION: ECOG PERFORMANCE STATUS: 1 - Symptomatic but completely ambulatory  Vitals:   11/09/20 1347  BP: 128/74  Pulse: 78  Resp: 18  Temp: 97.9 F (36.6 C)  SpO2: 98%   Filed Weights   11/09/20 1347  Weight: 125 lb 8 oz (56.9 kg)       RADIOGRAPHIC STUDIES: I have personally reviewed the radiological reports and agreed with the findings in the report.  ASSESSMENT AND PLAN:  1.  History of breast cancer with heterogeneous bone marrow signal in the spine: Patient was scheduled for bone scan next Thursday. I will connect with her on Friday to discuss the results and contemplate on the next plan. We can possibly get a PET CT scan versus MRI of the spine. Depending on what we find we may need to consider biopsy.  2. we briefly discussed the spectrum of treatment options for metastatic breast cancer from antiestrogen therapy to CDK 4 and 6 inhibitors.  However it is too premature to discuss treatment options without understanding whether or not she has metastatic disease and whether or not it has estrogen receptors.  Telephone  visit next Friday to discuss results.  All questions were answered. The patient knows to call the clinic with any problems, questions or concerns.   Rulon Eisenmenger, MD, MPH 11/09/2020    I, Molly Dorshimer, am acting as scribe for Nicholas Lose, MD.  I have reviewed the above documentation for accuracy and completeness, and I agree with the above.

## 2020-11-09 ENCOUNTER — Inpatient Hospital Stay: Payer: Medicare PPO | Attending: Hematology and Oncology | Admitting: Hematology and Oncology

## 2020-11-09 ENCOUNTER — Other Ambulatory Visit: Payer: Self-pay

## 2020-11-09 ENCOUNTER — Ambulatory Visit: Payer: Medicare PPO

## 2020-11-09 ENCOUNTER — Telehealth: Payer: Self-pay | Admitting: Hematology and Oncology

## 2020-11-09 DIAGNOSIS — M859 Disorder of bone density and structure, unspecified: Secondary | ICD-10-CM | POA: Diagnosis not present

## 2020-11-09 DIAGNOSIS — Z853 Personal history of malignant neoplasm of breast: Secondary | ICD-10-CM | POA: Diagnosis not present

## 2020-11-09 MED ORDER — HYDROCODONE-ACETAMINOPHEN 10-325 MG/15ML PO SOLN: 0.5000 | Freq: Every day | ORAL | 0 refills | Status: DC | PRN

## 2020-11-09 NOTE — Telephone Encounter (Signed)
Scheduled follow-up appointment per 12/1 los. Patient is aware. 

## 2020-11-15 ENCOUNTER — Encounter (HOSPITAL_COMMUNITY)
Admission: RE | Admit: 2020-11-15 | Discharge: 2020-11-15 | Disposition: A | Discharge status: Home / Self Care | Payer: Medicare PPO | Source: Ambulatory Visit | Attending: Family Medicine | Admitting: Family Medicine

## 2020-11-15 ENCOUNTER — Other Ambulatory Visit: Payer: Self-pay

## 2020-11-15 DIAGNOSIS — M170 Bilateral primary osteoarthritis of knee: Secondary | ICD-10-CM | POA: Diagnosis not present

## 2020-11-15 DIAGNOSIS — R935 Abnormal findings on diagnostic imaging of other abdominal regions, including retroperitoneum: Secondary | ICD-10-CM | POA: Diagnosis not present

## 2020-11-15 DIAGNOSIS — M160 Bilateral primary osteoarthritis of hip: Secondary | ICD-10-CM | POA: Diagnosis not present

## 2020-11-15 DIAGNOSIS — M19011 Primary osteoarthritis, right shoulder: Secondary | ICD-10-CM | POA: Diagnosis not present

## 2020-11-15 DIAGNOSIS — C50919 Malignant neoplasm of unspecified site of unspecified female breast: Secondary | ICD-10-CM | POA: Diagnosis not present

## 2020-11-15 MED ORDER — TECHNETIUM TC 99M MEDRONATE IV KIT
20.0000 | PACK | Freq: Once | INTRAVENOUS | Status: AC | PRN
  Administered 2020-11-15: 20 via INTRAVENOUS

## 2020-11-15 NOTE — Progress Notes (Signed)
  HEMATOLOGY-ONCOLOGY TELEPHONE VISIT PROGRESS NOTE  I connected with Shelby Peterson on 11/16/2020 at 10:15 AM EST by telephone and verified that I am speaking with the correct person using two identifiers.  I discussed the limitations, risks, security and privacy concerns of performing an evaluation and management service by telephone and the availability of in person appointments.  I also discussed with the patient that there may be a patient responsible charge related to this service. The patient expressed understanding and agreed to proceed.   History of Present Illness: Shelby Peterson is a 75 y.o. female with above-mentioned history of breast cancer and recent scans suspicious for metastatic disease. Bone scan on 11/16/19 showed no evidence of metastatic disease in the bones.  She presents over the phone today to review her scan.   Observations/Objective:    Assessment Plan:  History of breast cancer History of breast cancer 1999 stage I ER/PR positive (bilateral mastectomies with reconstruction): Treated by Dr. Jana Hakim with heterogeneous bone marrow signal in the spine (CT abdomen pelvis 11/02/2020: Performed to evaluate 30 pound weight loss revealed heterogeneous bone mineralization throughout the visible spine and pelvis) BRCA mutation present  11/15/2020: Bone scan: No evidence of bone metastases  I discussed with the patient that her bone scan was very clear about no evidence of bone metastases.  I do not recommend any further imaging.  Previous MRIs of the spine (2019) did not show any evidence of metastatic disease.  Weight loss is primarily due to her dietary habits which have changed significantly.  I discussed with her about seeing the dietitian.  Return to clinic on an as-needed basis.    I discussed the assessment and treatment plan with the patient. The patient was provided an opportunity to ask questions and all were answered. The patient agreed with the plan  and demonstrated an understanding of the instructions. The patient was advised to call back or seek an in-person evaluation if the symptoms worsen or if the condition fails to improve as anticipated.   I provided 12 minutes of non-face-to-face time during this encounter.   Rulon Eisenmenger, MD 11/16/2020    I, Molly Dorshimer, am acting as scribe for Nicholas Lose, MD.  I have reviewed the above documentation for accuracy and completeness, and I agree with the above.

## 2020-11-16 ENCOUNTER — Inpatient Hospital Stay (HOSPITAL_BASED_OUTPATIENT_CLINIC_OR_DEPARTMENT_OTHER): Payer: Medicare PPO | Admitting: Hematology and Oncology

## 2020-11-16 DIAGNOSIS — Z853 Personal history of malignant neoplasm of breast: Secondary | ICD-10-CM

## 2020-11-16 NOTE — Assessment & Plan Note (Signed)
History of breast cancer 1999 stage I ER/PR positive (bilateral mastectomies with reconstruction): Treated by Dr. Jana Hakim with heterogeneous bone marrow signal in the spine (CT abdomen pelvis 11/02/2020: Performed to evaluate 30 pound weight loss revealed heterogeneous bone mineralization throughout the visible spine and pelvis) BRCA mutation present  11/15/2020: Bone scan: No evidence of bone metastases  I discussed with the patient that her bone scan was very clear about no evidence of bone metastases.  I do not recommend any further imaging.  Previous MRIs of the spine (2019) did not show any evidence of metastatic disease.  Weight loss is primarily due to her dietary habits which have changed significantly.  Potentially also related to Metformin therapy.

## 2020-11-26 DIAGNOSIS — M542 Cervicalgia: Secondary | ICD-10-CM | POA: Diagnosis not present

## 2020-11-26 DIAGNOSIS — Z79891 Long term (current) use of opiate analgesic: Secondary | ICD-10-CM | POA: Diagnosis not present

## 2020-11-26 DIAGNOSIS — M5136 Other intervertebral disc degeneration, lumbar region: Secondary | ICD-10-CM | POA: Diagnosis not present

## 2020-11-26 DIAGNOSIS — G894 Chronic pain syndrome: Secondary | ICD-10-CM | POA: Diagnosis not present

## 2020-12-09 DIAGNOSIS — M791 Myalgia, unspecified site: Secondary | ICD-10-CM | POA: Diagnosis not present

## 2021-01-02 DIAGNOSIS — Z853 Personal history of malignant neoplasm of breast: Secondary | ICD-10-CM | POA: Diagnosis not present

## 2021-01-02 DIAGNOSIS — F411 Generalized anxiety disorder: Secondary | ICD-10-CM | POA: Diagnosis not present

## 2021-01-02 DIAGNOSIS — N3010 Interstitial cystitis (chronic) without hematuria: Secondary | ICD-10-CM | POA: Diagnosis not present

## 2021-01-02 DIAGNOSIS — R634 Abnormal weight loss: Secondary | ICD-10-CM | POA: Diagnosis not present

## 2021-01-02 DIAGNOSIS — R7309 Other abnormal glucose: Secondary | ICD-10-CM | POA: Diagnosis not present

## 2021-01-02 DIAGNOSIS — M797 Fibromyalgia: Secondary | ICD-10-CM | POA: Diagnosis not present

## 2021-01-02 DIAGNOSIS — G894 Chronic pain syndrome: Secondary | ICD-10-CM | POA: Diagnosis not present

## 2021-01-02 DIAGNOSIS — M3500 Sicca syndrome, unspecified: Secondary | ICD-10-CM | POA: Diagnosis not present

## 2021-01-02 DIAGNOSIS — M4800 Spinal stenosis, site unspecified: Secondary | ICD-10-CM | POA: Diagnosis not present

## 2021-01-06 DIAGNOSIS — M791 Myalgia, unspecified site: Secondary | ICD-10-CM | POA: Diagnosis not present

## 2021-01-31 DIAGNOSIS — Z79899 Other long term (current) drug therapy: Secondary | ICD-10-CM | POA: Diagnosis not present

## 2021-01-31 DIAGNOSIS — E7800 Pure hypercholesterolemia, unspecified: Secondary | ICD-10-CM | POA: Diagnosis not present

## 2021-01-31 DIAGNOSIS — E538 Deficiency of other specified B group vitamins: Secondary | ICD-10-CM | POA: Diagnosis not present

## 2021-01-31 DIAGNOSIS — E559 Vitamin D deficiency, unspecified: Secondary | ICD-10-CM | POA: Diagnosis not present

## 2021-02-04 DIAGNOSIS — Z1389 Encounter for screening for other disorder: Secondary | ICD-10-CM | POA: Diagnosis not present

## 2021-02-04 DIAGNOSIS — Z0000 Encounter for general adult medical examination without abnormal findings: Secondary | ICD-10-CM | POA: Diagnosis not present

## 2021-02-05 ENCOUNTER — Other Ambulatory Visit: Payer: Self-pay | Admitting: Family Medicine

## 2021-02-05 DIAGNOSIS — R7303 Prediabetes: Secondary | ICD-10-CM | POA: Diagnosis not present

## 2021-02-05 DIAGNOSIS — N3010 Interstitial cystitis (chronic) without hematuria: Secondary | ICD-10-CM | POA: Diagnosis not present

## 2021-02-05 DIAGNOSIS — A609 Anogenital herpesviral infection, unspecified: Secondary | ICD-10-CM | POA: Diagnosis not present

## 2021-02-05 DIAGNOSIS — M3500 Sicca syndrome, unspecified: Secondary | ICD-10-CM | POA: Diagnosis not present

## 2021-02-05 DIAGNOSIS — G479 Sleep disorder, unspecified: Secondary | ICD-10-CM | POA: Diagnosis not present

## 2021-02-05 DIAGNOSIS — M810 Age-related osteoporosis without current pathological fracture: Secondary | ICD-10-CM

## 2021-02-05 DIAGNOSIS — M549 Dorsalgia, unspecified: Secondary | ICD-10-CM | POA: Diagnosis not present

## 2021-02-05 DIAGNOSIS — E7800 Pure hypercholesterolemia, unspecified: Secondary | ICD-10-CM | POA: Diagnosis not present

## 2021-02-05 DIAGNOSIS — M5416 Radiculopathy, lumbar region: Secondary | ICD-10-CM | POA: Diagnosis not present

## 2021-02-05 DIAGNOSIS — M419 Scoliosis, unspecified: Secondary | ICD-10-CM | POA: Diagnosis not present

## 2021-02-05 DIAGNOSIS — Z0000 Encounter for general adult medical examination without abnormal findings: Secondary | ICD-10-CM | POA: Diagnosis not present

## 2021-02-05 DIAGNOSIS — M2170 Unequal limb length (acquired), unspecified site: Secondary | ICD-10-CM | POA: Diagnosis not present

## 2021-02-11 DIAGNOSIS — M545 Low back pain, unspecified: Secondary | ICD-10-CM | POA: Diagnosis not present

## 2021-03-12 DIAGNOSIS — S80862A Insect bite (nonvenomous), left lower leg, initial encounter: Secondary | ICD-10-CM | POA: Diagnosis not present

## 2021-03-12 DIAGNOSIS — R293 Abnormal posture: Secondary | ICD-10-CM | POA: Diagnosis not present

## 2021-03-12 DIAGNOSIS — M6281 Muscle weakness (generalized): Secondary | ICD-10-CM | POA: Diagnosis not present

## 2021-03-12 DIAGNOSIS — M545 Low back pain, unspecified: Secondary | ICD-10-CM | POA: Diagnosis not present

## 2021-03-15 DIAGNOSIS — M545 Low back pain, unspecified: Secondary | ICD-10-CM | POA: Diagnosis not present

## 2021-03-15 DIAGNOSIS — M6281 Muscle weakness (generalized): Secondary | ICD-10-CM | POA: Diagnosis not present

## 2021-03-15 DIAGNOSIS — R293 Abnormal posture: Secondary | ICD-10-CM | POA: Diagnosis not present

## 2021-03-20 DIAGNOSIS — M545 Low back pain, unspecified: Secondary | ICD-10-CM | POA: Diagnosis not present

## 2021-03-20 DIAGNOSIS — R293 Abnormal posture: Secondary | ICD-10-CM | POA: Diagnosis not present

## 2021-03-20 DIAGNOSIS — M6281 Muscle weakness (generalized): Secondary | ICD-10-CM | POA: Diagnosis not present

## 2021-03-25 DIAGNOSIS — M6281 Muscle weakness (generalized): Secondary | ICD-10-CM | POA: Diagnosis not present

## 2021-03-25 DIAGNOSIS — M545 Low back pain, unspecified: Secondary | ICD-10-CM | POA: Diagnosis not present

## 2021-03-25 DIAGNOSIS — R293 Abnormal posture: Secondary | ICD-10-CM | POA: Diagnosis not present

## 2021-04-02 DIAGNOSIS — H43813 Vitreous degeneration, bilateral: Secondary | ICD-10-CM | POA: Diagnosis not present

## 2021-04-02 DIAGNOSIS — H3530 Unspecified macular degeneration: Secondary | ICD-10-CM | POA: Diagnosis not present

## 2021-04-02 DIAGNOSIS — M3501 Sicca syndrome with keratoconjunctivitis: Secondary | ICD-10-CM | POA: Diagnosis not present

## 2021-04-02 DIAGNOSIS — Z961 Presence of intraocular lens: Secondary | ICD-10-CM | POA: Diagnosis not present

## 2021-04-08 DIAGNOSIS — M6281 Muscle weakness (generalized): Secondary | ICD-10-CM | POA: Diagnosis not present

## 2021-04-08 DIAGNOSIS — R293 Abnormal posture: Secondary | ICD-10-CM | POA: Diagnosis not present

## 2021-04-08 DIAGNOSIS — M545 Low back pain, unspecified: Secondary | ICD-10-CM | POA: Diagnosis not present

## 2021-04-11 DIAGNOSIS — R293 Abnormal posture: Secondary | ICD-10-CM | POA: Diagnosis not present

## 2021-04-11 DIAGNOSIS — M545 Low back pain, unspecified: Secondary | ICD-10-CM | POA: Diagnosis not present

## 2021-04-11 DIAGNOSIS — M6281 Muscle weakness (generalized): Secondary | ICD-10-CM | POA: Diagnosis not present

## 2021-04-16 DIAGNOSIS — M545 Low back pain, unspecified: Secondary | ICD-10-CM | POA: Diagnosis not present

## 2021-04-16 DIAGNOSIS — R293 Abnormal posture: Secondary | ICD-10-CM | POA: Diagnosis not present

## 2021-04-16 DIAGNOSIS — M6281 Muscle weakness (generalized): Secondary | ICD-10-CM | POA: Diagnosis not present

## 2021-04-23 DIAGNOSIS — M545 Low back pain, unspecified: Secondary | ICD-10-CM | POA: Diagnosis not present

## 2021-04-23 DIAGNOSIS — R293 Abnormal posture: Secondary | ICD-10-CM | POA: Diagnosis not present

## 2021-04-23 DIAGNOSIS — M6281 Muscle weakness (generalized): Secondary | ICD-10-CM | POA: Diagnosis not present

## 2021-04-25 DIAGNOSIS — R293 Abnormal posture: Secondary | ICD-10-CM | POA: Diagnosis not present

## 2021-04-25 DIAGNOSIS — M545 Low back pain, unspecified: Secondary | ICD-10-CM | POA: Diagnosis not present

## 2021-04-25 DIAGNOSIS — M6281 Muscle weakness (generalized): Secondary | ICD-10-CM | POA: Diagnosis not present

## 2021-05-01 DIAGNOSIS — R293 Abnormal posture: Secondary | ICD-10-CM | POA: Diagnosis not present

## 2021-05-01 DIAGNOSIS — M545 Low back pain, unspecified: Secondary | ICD-10-CM | POA: Diagnosis not present

## 2021-05-01 DIAGNOSIS — M6281 Muscle weakness (generalized): Secondary | ICD-10-CM | POA: Diagnosis not present

## 2021-05-06 DIAGNOSIS — R293 Abnormal posture: Secondary | ICD-10-CM | POA: Diagnosis not present

## 2021-05-06 DIAGNOSIS — M545 Low back pain, unspecified: Secondary | ICD-10-CM | POA: Diagnosis not present

## 2021-05-06 DIAGNOSIS — M6281 Muscle weakness (generalized): Secondary | ICD-10-CM | POA: Diagnosis not present

## 2021-05-07 DIAGNOSIS — M5416 Radiculopathy, lumbar region: Secondary | ICD-10-CM | POA: Diagnosis not present

## 2021-05-07 DIAGNOSIS — M549 Dorsalgia, unspecified: Secondary | ICD-10-CM | POA: Diagnosis not present

## 2021-05-07 DIAGNOSIS — M542 Cervicalgia: Secondary | ICD-10-CM | POA: Diagnosis not present

## 2021-05-07 DIAGNOSIS — M810 Age-related osteoporosis without current pathological fracture: Secondary | ICD-10-CM | POA: Diagnosis not present

## 2021-05-07 DIAGNOSIS — M419 Scoliosis, unspecified: Secondary | ICD-10-CM | POA: Diagnosis not present

## 2021-05-07 DIAGNOSIS — M4726 Other spondylosis with radiculopathy, lumbar region: Secondary | ICD-10-CM | POA: Diagnosis not present

## 2021-05-21 DIAGNOSIS — G894 Chronic pain syndrome: Secondary | ICD-10-CM | POA: Diagnosis not present

## 2021-05-21 DIAGNOSIS — M5136 Other intervertebral disc degeneration, lumbar region: Secondary | ICD-10-CM | POA: Diagnosis not present

## 2021-05-21 DIAGNOSIS — M47816 Spondylosis without myelopathy or radiculopathy, lumbar region: Secondary | ICD-10-CM | POA: Diagnosis not present

## 2021-06-28 DIAGNOSIS — M549 Dorsalgia, unspecified: Secondary | ICD-10-CM | POA: Diagnosis not present

## 2021-06-28 DIAGNOSIS — M47816 Spondylosis without myelopathy or radiculopathy, lumbar region: Secondary | ICD-10-CM | POA: Diagnosis not present

## 2021-06-28 DIAGNOSIS — M898X8 Other specified disorders of bone, other site: Secondary | ICD-10-CM | POA: Diagnosis not present

## 2021-07-03 DIAGNOSIS — D225 Melanocytic nevi of trunk: Secondary | ICD-10-CM | POA: Diagnosis not present

## 2021-07-03 DIAGNOSIS — D2262 Melanocytic nevi of left upper limb, including shoulder: Secondary | ICD-10-CM | POA: Diagnosis not present

## 2021-07-03 DIAGNOSIS — D2261 Melanocytic nevi of right upper limb, including shoulder: Secondary | ICD-10-CM | POA: Diagnosis not present

## 2021-07-03 DIAGNOSIS — D1801 Hemangioma of skin and subcutaneous tissue: Secondary | ICD-10-CM | POA: Diagnosis not present

## 2021-07-03 DIAGNOSIS — L400 Psoriasis vulgaris: Secondary | ICD-10-CM | POA: Diagnosis not present

## 2021-07-08 DIAGNOSIS — M5136 Other intervertebral disc degeneration, lumbar region: Secondary | ICD-10-CM | POA: Diagnosis not present

## 2021-07-08 DIAGNOSIS — M542 Cervicalgia: Secondary | ICD-10-CM | POA: Diagnosis not present

## 2021-07-08 DIAGNOSIS — M5137 Other intervertebral disc degeneration, lumbosacral region: Secondary | ICD-10-CM | POA: Diagnosis not present

## 2021-07-08 DIAGNOSIS — D1809 Hemangioma of other sites: Secondary | ICD-10-CM | POA: Diagnosis not present

## 2021-07-08 DIAGNOSIS — M419 Scoliosis, unspecified: Secondary | ICD-10-CM | POA: Diagnosis not present

## 2021-07-25 DIAGNOSIS — M810 Age-related osteoporosis without current pathological fracture: Secondary | ICD-10-CM | POA: Diagnosis not present

## 2021-07-25 DIAGNOSIS — Z23 Encounter for immunization: Secondary | ICD-10-CM | POA: Diagnosis not present

## 2021-08-19 ENCOUNTER — Other Ambulatory Visit: Payer: Medicare PPO

## 2021-08-29 DIAGNOSIS — M549 Dorsalgia, unspecified: Secondary | ICD-10-CM | POA: Diagnosis not present

## 2021-08-29 DIAGNOSIS — M47816 Spondylosis without myelopathy or radiculopathy, lumbar region: Secondary | ICD-10-CM | POA: Diagnosis not present

## 2021-09-05 DIAGNOSIS — H04123 Dry eye syndrome of bilateral lacrimal glands: Secondary | ICD-10-CM | POA: Diagnosis not present

## 2021-10-01 DIAGNOSIS — Z6820 Body mass index (BMI) 20.0-20.9, adult: Secondary | ICD-10-CM | POA: Diagnosis not present

## 2021-10-01 DIAGNOSIS — U071 COVID-19: Secondary | ICD-10-CM | POA: Diagnosis not present

## 2021-10-01 DIAGNOSIS — R059 Cough, unspecified: Secondary | ICD-10-CM | POA: Diagnosis not present

## 2021-11-12 DIAGNOSIS — H11123 Conjunctival concretions, bilateral: Secondary | ICD-10-CM | POA: Diagnosis not present

## 2021-11-12 DIAGNOSIS — H1013 Acute atopic conjunctivitis, bilateral: Secondary | ICD-10-CM | POA: Diagnosis not present

## 2021-11-27 DIAGNOSIS — H43813 Vitreous degeneration, bilateral: Secondary | ICD-10-CM | POA: Diagnosis not present

## 2021-11-27 DIAGNOSIS — H353130 Nonexudative age-related macular degeneration, bilateral, stage unspecified: Secondary | ICD-10-CM | POA: Diagnosis not present

## 2021-11-27 DIAGNOSIS — Z961 Presence of intraocular lens: Secondary | ICD-10-CM | POA: Diagnosis not present

## 2021-11-27 DIAGNOSIS — M3500 Sicca syndrome, unspecified: Secondary | ICD-10-CM | POA: Diagnosis not present

## 2021-12-03 DIAGNOSIS — M47816 Spondylosis without myelopathy or radiculopathy, lumbar region: Secondary | ICD-10-CM | POA: Diagnosis not present

## 2021-12-03 DIAGNOSIS — M419 Scoliosis, unspecified: Secondary | ICD-10-CM | POA: Diagnosis not present

## 2021-12-16 DIAGNOSIS — M419 Scoliosis, unspecified: Secondary | ICD-10-CM | POA: Diagnosis not present

## 2021-12-16 DIAGNOSIS — M47816 Spondylosis without myelopathy or radiculopathy, lumbar region: Secondary | ICD-10-CM | POA: Diagnosis not present

## 2021-12-23 DIAGNOSIS — R531 Weakness: Secondary | ICD-10-CM | POA: Diagnosis not present

## 2021-12-23 DIAGNOSIS — M545 Low back pain, unspecified: Secondary | ICD-10-CM | POA: Diagnosis not present

## 2021-12-23 DIAGNOSIS — M419 Scoliosis, unspecified: Secondary | ICD-10-CM | POA: Diagnosis not present

## 2021-12-27 DIAGNOSIS — M545 Low back pain, unspecified: Secondary | ICD-10-CM | POA: Diagnosis not present

## 2021-12-27 DIAGNOSIS — M419 Scoliosis, unspecified: Secondary | ICD-10-CM | POA: Diagnosis not present

## 2021-12-27 DIAGNOSIS — R531 Weakness: Secondary | ICD-10-CM | POA: Diagnosis not present

## 2021-12-31 DIAGNOSIS — L570 Actinic keratosis: Secondary | ICD-10-CM | POA: Diagnosis not present

## 2021-12-31 DIAGNOSIS — M545 Low back pain, unspecified: Secondary | ICD-10-CM | POA: Diagnosis not present

## 2021-12-31 DIAGNOSIS — L820 Inflamed seborrheic keratosis: Secondary | ICD-10-CM | POA: Diagnosis not present

## 2021-12-31 DIAGNOSIS — R531 Weakness: Secondary | ICD-10-CM | POA: Diagnosis not present

## 2021-12-31 DIAGNOSIS — D485 Neoplasm of uncertain behavior of skin: Secondary | ICD-10-CM | POA: Diagnosis not present

## 2021-12-31 DIAGNOSIS — L400 Psoriasis vulgaris: Secondary | ICD-10-CM | POA: Diagnosis not present

## 2021-12-31 DIAGNOSIS — M419 Scoliosis, unspecified: Secondary | ICD-10-CM | POA: Diagnosis not present

## 2021-12-31 DIAGNOSIS — D1801 Hemangioma of skin and subcutaneous tissue: Secondary | ICD-10-CM | POA: Diagnosis not present

## 2022-01-08 DIAGNOSIS — M419 Scoliosis, unspecified: Secondary | ICD-10-CM | POA: Diagnosis not present

## 2022-01-08 DIAGNOSIS — M545 Low back pain, unspecified: Secondary | ICD-10-CM | POA: Diagnosis not present

## 2022-01-08 DIAGNOSIS — R531 Weakness: Secondary | ICD-10-CM | POA: Diagnosis not present

## 2022-01-14 DIAGNOSIS — R531 Weakness: Secondary | ICD-10-CM | POA: Diagnosis not present

## 2022-01-14 DIAGNOSIS — M545 Low back pain, unspecified: Secondary | ICD-10-CM | POA: Diagnosis not present

## 2022-01-14 DIAGNOSIS — M419 Scoliosis, unspecified: Secondary | ICD-10-CM | POA: Diagnosis not present

## 2022-01-20 DIAGNOSIS — M419 Scoliosis, unspecified: Secondary | ICD-10-CM | POA: Diagnosis not present

## 2022-01-20 DIAGNOSIS — R531 Weakness: Secondary | ICD-10-CM | POA: Diagnosis not present

## 2022-01-20 DIAGNOSIS — M545 Low back pain, unspecified: Secondary | ICD-10-CM | POA: Diagnosis not present

## 2022-01-27 DIAGNOSIS — M419 Scoliosis, unspecified: Secondary | ICD-10-CM | POA: Diagnosis not present

## 2022-01-27 DIAGNOSIS — R531 Weakness: Secondary | ICD-10-CM | POA: Diagnosis not present

## 2022-01-27 DIAGNOSIS — M545 Low back pain, unspecified: Secondary | ICD-10-CM | POA: Diagnosis not present

## 2022-01-29 DIAGNOSIS — R531 Weakness: Secondary | ICD-10-CM | POA: Diagnosis not present

## 2022-01-29 DIAGNOSIS — M419 Scoliosis, unspecified: Secondary | ICD-10-CM | POA: Diagnosis not present

## 2022-01-29 DIAGNOSIS — M545 Low back pain, unspecified: Secondary | ICD-10-CM | POA: Diagnosis not present

## 2022-02-04 DIAGNOSIS — M419 Scoliosis, unspecified: Secondary | ICD-10-CM | POA: Diagnosis not present

## 2022-02-04 DIAGNOSIS — R531 Weakness: Secondary | ICD-10-CM | POA: Diagnosis not present

## 2022-02-04 DIAGNOSIS — M545 Low back pain, unspecified: Secondary | ICD-10-CM | POA: Diagnosis not present

## 2022-02-06 DIAGNOSIS — E559 Vitamin D deficiency, unspecified: Secondary | ICD-10-CM | POA: Diagnosis not present

## 2022-02-06 DIAGNOSIS — Z0000 Encounter for general adult medical examination without abnormal findings: Secondary | ICD-10-CM | POA: Diagnosis not present

## 2022-02-06 DIAGNOSIS — Z1211 Encounter for screening for malignant neoplasm of colon: Secondary | ICD-10-CM | POA: Diagnosis not present

## 2022-02-06 DIAGNOSIS — E7800 Pure hypercholesterolemia, unspecified: Secondary | ICD-10-CM | POA: Diagnosis not present

## 2022-02-06 DIAGNOSIS — N3010 Interstitial cystitis (chronic) without hematuria: Secondary | ICD-10-CM | POA: Diagnosis not present

## 2022-02-06 DIAGNOSIS — E538 Deficiency of other specified B group vitamins: Secondary | ICD-10-CM | POA: Diagnosis not present

## 2022-02-06 DIAGNOSIS — K5901 Slow transit constipation: Secondary | ICD-10-CM | POA: Diagnosis not present

## 2022-02-06 DIAGNOSIS — N3281 Overactive bladder: Secondary | ICD-10-CM | POA: Diagnosis not present

## 2022-02-06 DIAGNOSIS — Z79899 Other long term (current) drug therapy: Secondary | ICD-10-CM | POA: Diagnosis not present

## 2022-02-06 DIAGNOSIS — R7303 Prediabetes: Secondary | ICD-10-CM | POA: Diagnosis not present

## 2022-02-06 DIAGNOSIS — N958 Other specified menopausal and perimenopausal disorders: Secondary | ICD-10-CM | POA: Diagnosis not present

## 2022-02-10 DIAGNOSIS — M545 Low back pain, unspecified: Secondary | ICD-10-CM | POA: Diagnosis not present

## 2022-02-10 DIAGNOSIS — M419 Scoliosis, unspecified: Secondary | ICD-10-CM | POA: Diagnosis not present

## 2022-02-10 DIAGNOSIS — R531 Weakness: Secondary | ICD-10-CM | POA: Diagnosis not present

## 2022-02-11 DIAGNOSIS — Z79899 Other long term (current) drug therapy: Secondary | ICD-10-CM | POA: Diagnosis not present

## 2022-02-11 DIAGNOSIS — E538 Deficiency of other specified B group vitamins: Secondary | ICD-10-CM | POA: Diagnosis not present

## 2022-02-11 DIAGNOSIS — R7303 Prediabetes: Secondary | ICD-10-CM | POA: Diagnosis not present

## 2022-02-11 DIAGNOSIS — A609 Anogenital herpesviral infection, unspecified: Secondary | ICD-10-CM | POA: Diagnosis not present

## 2022-02-11 DIAGNOSIS — E559 Vitamin D deficiency, unspecified: Secondary | ICD-10-CM | POA: Diagnosis not present

## 2022-02-11 DIAGNOSIS — G479 Sleep disorder, unspecified: Secondary | ICD-10-CM | POA: Diagnosis not present

## 2022-02-11 DIAGNOSIS — N952 Postmenopausal atrophic vaginitis: Secondary | ICD-10-CM | POA: Diagnosis not present

## 2022-02-11 DIAGNOSIS — E7800 Pure hypercholesterolemia, unspecified: Secondary | ICD-10-CM | POA: Diagnosis not present

## 2022-02-11 DIAGNOSIS — Z0000 Encounter for general adult medical examination without abnormal findings: Secondary | ICD-10-CM | POA: Diagnosis not present

## 2022-02-21 DIAGNOSIS — M419 Scoliosis, unspecified: Secondary | ICD-10-CM | POA: Diagnosis not present

## 2022-02-21 DIAGNOSIS — R531 Weakness: Secondary | ICD-10-CM | POA: Diagnosis not present

## 2022-02-21 DIAGNOSIS — M545 Low back pain, unspecified: Secondary | ICD-10-CM | POA: Diagnosis not present

## 2022-03-18 ENCOUNTER — Other Ambulatory Visit: Payer: Self-pay | Admitting: Family Medicine

## 2022-03-18 DIAGNOSIS — M810 Age-related osteoporosis without current pathological fracture: Secondary | ICD-10-CM

## 2022-03-24 DIAGNOSIS — Z79899 Other long term (current) drug therapy: Secondary | ICD-10-CM | POA: Diagnosis not present

## 2022-03-24 DIAGNOSIS — L400 Psoriasis vulgaris: Secondary | ICD-10-CM | POA: Diagnosis not present

## 2022-04-08 DIAGNOSIS — M47812 Spondylosis without myelopathy or radiculopathy, cervical region: Secondary | ICD-10-CM | POA: Diagnosis not present

## 2022-04-08 DIAGNOSIS — M4312 Spondylolisthesis, cervical region: Secondary | ICD-10-CM | POA: Diagnosis not present

## 2022-04-08 DIAGNOSIS — M542 Cervicalgia: Secondary | ICD-10-CM | POA: Diagnosis not present

## 2022-04-09 NOTE — Progress Notes (Signed)
Office Visit    Patient Name: Isella Slatten Date of Encounter: 04/10/2022  Primary Care Provider:  Lawerance Cruel, MD Primary Cardiologist:  Quay Burow, MD  Chief Complaint    76 year old female a history of atypical chest pain, dyspnea on exertion, syncope, hyperlipidemia, Sjogren's syndrome, fibromyalgia, osteoporosis, chronic back pain, interstitial cystitis, and breast cancer s/p bilateral mastectomy who presents for follow-up related to hyperlipidemia and syncope.  Past Medical History    Past Medical History:  Diagnosis Date   Cancer ([place])    Dyspnea    2D ECHO, 10/18/2012 - EF-60-65%, normal   Family history of early CAD    LEXISCAN, 10/19/2012 - normal   Fibromyalgia    Hyperlipidemia    Immune disorder ([place])    Osteoporosis    Sjoegren syndrome    Visual disorder    Past Surgical History:  Procedure Laterality Date   MASTECTOMY Bilateral    TUMOR REMOVAL     Benign on L ovary     Allergies  No Known Allergies  History of Present Illness    76 year old female with the above past medical history including atypical chest pain, dyspnea on exertion, syncope, hyperlipidemia, Sjogren's syndrome, fibromyalgia, osteoporosis, chronic back pain, interstitial cystitis, and breast cancer s/p bilateral mastectomy.   She follows with Dr. Gwenlyn Found. Her father had an MI at age 34.  She does have a history of dyspnea on exertion, atypical chest pain. Myoview stress test in December 2013 was normal except for breast attenuation artifact. She is statin intolerant. She declined PCSK9 inhibitor.  She has been maintained on Zetia.  She has a history of fibromyalgia and Sjogren's syndrome.  She has had epidural injections for chronic back pain.  Additionally, she follows with Dr. Amalia Hailey for interstitial cystitis. She was last seen in the office on 06/08/2019 and was stable from a cardiac standpoint. She denied symptoms concerning for angina. She presents today for  follow-up. Since her last visit she has been stable overall from a cardiac standpoint, however, she does report a syncopal episode that occurred 1 month ago.  She states she "woke up on the floor."  No preceding symptoms.  Over the past few weeks she has noted some intermittent dizziness, particular with positional changes.  BP has been stable overall.  She denies any further presyncope, syncope.  She denies symptoms concerning for angina.  He saw her PCP who noted her cholesterol was elevated.  She was restarted on Zetia.  Other than her recent syncopal episode, intermittent dizziness, she denies any additional concerns today.  Home Medications    Current Outpatient Medications  Medication Sig Dispense Refill   acetaminophen (TYLENOL) 650 MG CR tablet Take 650 mg by mouth every 8 (eight) hours as needed for pain.     acyclovir (ZOVIRAX) 400 MG tablet Take 400 mg by mouth 2 (two) times daily as needed.      celecoxib (CELEBREX) 200 MG capsule Take by mouth.     Cholecalciferol (VITAMIN D-3) 5000 UNITS TABS Take 1 tablet by mouth once a week.     ezetimibe (ZETIA) 10 MG tablet Take 1 tablet by mouth daily.     Fexofenadine HCl (ALLEGRA ALLERGY PO) Take by mouth.     fish oil-omega-3 fatty acids 1000 MG capsule Take 1 g by mouth daily.     gabapentin (NEURONTIN) 300 MG capsule SMARTSIG:2-3 Capsule(s) By Mouth Daily     HYDROcodone-Acetaminophen 10-325 MG/15ML SOLN Take 0.5 tablets by mouth daily as  needed.  0   Melatonin 5 MG TABS Take 5 mg by mouth at bedtime.     Menaquinone-7 (VITAMIN K2) 100 MCG CAPS Take 1 tablet by mouth daily.     Multiple Vitamins-Minerals (PRESERVISION AREDS PO) Take 1 tablet by mouth daily.     RESTASIS 0.05 % ophthalmic emulsion      traZODone (DESYREL) 100 MG tablet Take 100 mg by mouth at bedtime.     trimethoprim (TRIMPEX) 100 MG tablet Take 100 mg by mouth as needed.     No current facility-administered medications for this visit.     Review of Systems    She  denies chest pain, palpitations, dyspnea, pnd, orthopnea, n, v, edema, weight gain, or early satiety. All other systems reviewed and are otherwise negative except as noted above.   Physical Exam    VS:  BP 126/74   Pulse 73   Ht '5\' 9"'$  (1.753 m)   Wt 132 lb 3.2 oz (60 kg)   SpO2 98%   BMI 19.52 kg/m  ,  GEN: Well nourished, well developed, in no acute distress. HEENT: normal. Neck: Supple, no JVD, carotid bruits, or masses. Cardiac: RRR, no murmurs, rubs, or gallops. No clubbing, cyanosis, edema.  Radials/DP/PT 2+ and equal bilaterally.  Respiratory:  Respirations regular and unlabored, clear to auscultation bilaterally. GI: Soft, nontender, nondistended, BS + x 4. MS: no deformity or atrophy. Skin: warm and dry, no rash. Neuro:  Strength and sensation are intact. Psych: Normal affect.  Accessory Clinical Findings    ECG personally reviewed by me today -sinus rhythm, 73 bpm with occasional PVCs, LVH, non-specific ST/T wave changes- no acute changes.  No results found for: "WBC", "HGB", "HCT", "MCV", "PLT" No results found for: "CREATININE", "BUN", "NA", "K", "CL", "CO2" Lab Results  Component Value Date   ALT 13 03/23/2018   AST 21 03/23/2018   ALKPHOS 85 03/23/2018   BILITOT 0.3 03/23/2018   Lab Results  Component Value Date   CHOL 241 (H) 03/23/2018   HDL 61 03/23/2018   LDLCALC 153 (H) 03/23/2018   TRIG 137 03/23/2018   CHOLHDL 4.0 03/23/2018    No results found for: "HGBA1C"  Assessment & Plan    1. Syncope/dizziness: She had a syncopal episode that occurred 1 month ago where she states she "woke up on the floor." Denies any preceding symptoms.  Denies any further presyncope, syncope. She does note occasional dizziness particularly when changing positions. BP has been stable overall.  EKG shows sinus rhythm, 73 bpm with occasional PVCs. Given recent syncope, Will repeat echo, will check 14-day live zio monitor, carotid dopplers, BMET, CBC.  Discussed ED precautions.   Advised her not to drive until further work-up completed.    2. Hyperlipidemia: LDL 150 in 01/2022. She is statin intolerant.  Has declined PCSK9 inhibitor in the past.  Recently started on Zetia.  Monitored and managed per PCP.  3. Atypical chest pain/dyspnea on exertion: Myoview stress test in December 2013 was normal except for breast attenuation artifact. Stable with no anginal symptoms.  Discussed possible ischemic evaluation with coronary CT angiogram given recent syncopal episode, however, patient declines at this time.  4. Disposition: Follow-up in 2 months.   Lenna Sciara, NP 04/10/2022, 12:36 PM

## 2022-04-10 ENCOUNTER — Ambulatory Visit (INDEPENDENT_AMBULATORY_CARE_PROVIDER_SITE_OTHER): Payer: Medicare PPO

## 2022-04-10 ENCOUNTER — Encounter: Payer: Self-pay | Admitting: Nurse Practitioner

## 2022-04-10 ENCOUNTER — Ambulatory Visit: Payer: Medicare PPO | Admitting: Nurse Practitioner

## 2022-04-10 VITALS — BP 126/74 | HR 73 | Ht 69.0 in | Wt 132.2 lb

## 2022-04-10 DIAGNOSIS — E782 Mixed hyperlipidemia: Secondary | ICD-10-CM

## 2022-04-10 DIAGNOSIS — R0789 Other chest pain: Secondary | ICD-10-CM | POA: Diagnosis not present

## 2022-04-10 DIAGNOSIS — R55 Syncope and collapse: Secondary | ICD-10-CM | POA: Diagnosis not present

## 2022-04-10 DIAGNOSIS — R0609 Other forms of dyspnea: Secondary | ICD-10-CM

## 2022-04-10 DIAGNOSIS — R42 Dizziness and giddiness: Secondary | ICD-10-CM | POA: Diagnosis not present

## 2022-04-10 LAB — CBC
Hematocrit: 36.1 % (ref 34.0–46.6)
Hemoglobin: 12.4 g/dL (ref 11.1–15.9)
MCH: 31.6 pg (ref 26.6–33.0)
MCHC: 34.3 g/dL (ref 31.5–35.7)
MCV: 92 fL (ref 79–97)
Platelets: 195 10*3/uL (ref 150–450)
RBC: 3.93 x10E6/uL (ref 3.77–5.28)
RDW: 11.5 % — ABNORMAL LOW (ref 11.7–15.4)
WBC: 4.5 10*3/uL (ref 3.4–10.8)

## 2022-04-10 LAB — BASIC METABOLIC PANEL
BUN/Creatinine Ratio: 16 (ref 12–28)
BUN: 12 mg/dL (ref 8–27)
CO2: 28 mmol/L (ref 20–29)
Calcium: 8.8 mg/dL (ref 8.7–10.3)
Chloride: 97 mmol/L (ref 96–106)
Creatinine, Ser: 0.73 mg/dL (ref 0.57–1.00)
Glucose: 92 mg/dL (ref 70–99)
Potassium: 4.3 mmol/L (ref 3.5–5.2)
Sodium: 136 mmol/L (ref 134–144)
eGFR: 86 mL/min/{1.73_m2} (ref >59)

## 2022-04-10 NOTE — Progress Notes (Unsigned)
Enrolled for [employer] to mail a ZIO AT Live Telemetry monitor to patients address on file.   Dr. Berry to read. 

## 2022-04-10 NOTE — Patient Instructions (Signed)
Medication Instructions:  Your physician recommends that you continue on your current medications as directed. Please refer to the Current Medication list given to you today.   *If you need a refill on your cardiac medications before your next appointment, please call your pharmacy*   Lab Work: Your physician recommends that you complete labs today BMET CBC  If you have labs (blood work) drawn today and your tests are completely normal, you will receive your results only by: MyChart Message (if you have MyChart) OR A paper copy in the mail If you have any lab test that is abnormal or we need to change your treatment, we will call you to review the results.   Testing/Procedures: Your physician has requested that you have an echocardiogram. Echocardiography is a painless test that uses sound waves to create images of your heart. It provides your doctor with information about the size and shape of your heart and how well your heart's chambers and valves are working. This procedure takes approximately one hour. There are no restrictions for this procedure.   Your physician has requested that you have a carotid doppler. This test is an ultrasound of the carotid arteries in your neck. It looks at blood flow through these arteries that supply the brain with blood. Allow one hour for this exam. There are no restrictions or special instructions.   ZIO AT Long term monitor-Live Telemetry  Your physician has requested you wear a ZIO patch monitor for 14 days.  This is a single patch monitor. [employer] supplies one patch monitor per enrollment. Additional  stickers are not available.  Please do not apply patch if you will be having a Nuclear Stress Test, Echocardiogram, Cardiac CT, MRI,  or Chest Xray during the period you would be wearing the monitor. The patch cannot be worn during  these tests. You cannot remove and re-apply the ZIO AT patch monitor.  Your ZIO patch monitor will be mailed 3 day  USPS to your address on file. It may take 3-5 days to  receive your monitor after you have been enrolled.  Once you have received your monitor, please review the enclosed instructions. Your monitor has  already been registered assigning a specific monitor serial # to you.   Billing and Patient Assistance Program information  Theodore Demark has been supplied with any insurance information on record for billing. [employer] offers a sliding scale Patient Assistance Program for patients without insurance, or whose  insurance does not completely cover the cost of the ZIO patch monitor. You must apply for the  Patient Assistance Program to qualify for the discounted rate. To apply, call [employer] at [phone],  select option 4, select option 2 , ask to apply for the Patient Assistance Program, (you can request an  interpreter if needed). [employer] will ask your household income and how many people are in your  household. [employer] will quote your out-of-pocket cost based on this information. They will also be able  to set up a 12 month interest free payment plan if needed.  Applying the monitor   Shave hair from upper left chest.  Hold the abrader disc by orange tab. Rub the abrader in 40 strokes over left upper chest as indicated in  your monitor instructions.  Clean area with 4 enclosed alcohol pads. Use all pads to ensure the area is cleaned thoroughly. Let  dry.  Apply patch as indicated in monitor instructions. Patch will be placed under collarbone on left side of  chest with  arrow pointing upward.  Rub patch adhesive wings for 2 minutes. Remove the white label marked "1". Remove the white label  marked "2". Rub patch adhesive wings for 2 additional minutes.  While looking in a mirror, press and release button in center of patch. A small green light will flash 3-4  times. This will be your only indicator that the monitor has been turned on.  Do not shower for the first 24 hours. You may shower after  the first 24 hours.  Press the button if you feel a symptom. You will hear a small click. Record Date, Time and Symptom in  the Patient Log.   Starting the Gateway  In your kit there is a Hydrographic surveyor box the size of a cellphone. This is Airline pilot. It transmits all your  recorded data to [facility]. This box must always stay within 10 feet of you. Open the box and push the *  button. There will be a light that blinks orange and then green a few times. When the light stops  blinking, the Gateway is connected to the ZIO patch. Call [employer] at [phone] to confirm your monitor is transmitting.  Returning your monitor  Remove your patch and place it inside the [place]. In the lower half of the Gateway there is a white  bag with prepaid postage on it. Place Gateway in bag and seal. Mail package back to [place] as soon as  possible. Your physician should have your final report approximately 7 days after you have mailed back  your monitor. Call [facility] [place] at [phone] if you have questions regarding your ZIO AT  patch monitor. Call them immediately if you see an orange light blinking on your monitor.  If your monitor falls off in less than 4 days, contact our Monitor department at [phone]. If your  monitor becomes loose or falls off after 4 days call [employer] at [phone] for suggestions on  securing your monitor   Follow-Up: At [facility], you and your health needs are our priority.  As part of our continuing mission to provide you with exceptional heart care, we have created designated Provider Care Teams.  These Care Teams include your primary Cardiologist (physician) and Advanced Practice Providers (APPs -  Physician Assistants and Nurse Practitioners) who all work together to provide you with the care you need, when you need it.  We recommend signing up for the patient portal called "MyChart".  Sign up information is provided on this After Visit  Summary.  MyChart is used to connect with patients for Virtual Visits (Telemedicine).  Patients are able to view lab/test results, encounter notes, upcoming appointments, etc.  Non-urgent messages can be sent to your provider as well.   To learn more about what you can do with MyChart, go to [email or web address].    Your next appointment:   2 month(s)  The format for your next appointment:   In Person  Provider:   Diona Browner, NP        Other Instructions   Important Information About Sugar

## 2022-04-14 ENCOUNTER — Ambulatory Visit (HOSPITAL_COMMUNITY)
Admission: RE | Admit: 2022-04-14 | Discharge: 2022-04-14 | Disposition: A | Discharge status: Home / Self Care | Payer: Medicare PPO | Source: Ambulatory Visit | Attending: Cardiology | Admitting: Cardiology

## 2022-04-14 ENCOUNTER — Telehealth: Payer: Self-pay

## 2022-04-14 DIAGNOSIS — R55 Syncope and collapse: Secondary | ICD-10-CM | POA: Diagnosis not present

## 2022-04-14 DIAGNOSIS — R0609 Other forms of dyspnea: Secondary | ICD-10-CM | POA: Diagnosis not present

## 2022-04-15 DIAGNOSIS — R55 Syncope and collapse: Secondary | ICD-10-CM | POA: Diagnosis not present

## 2022-04-15 DIAGNOSIS — R0609 Other forms of dyspnea: Secondary | ICD-10-CM | POA: Diagnosis not present

## 2022-04-21 ENCOUNTER — Telehealth: Payer: Self-pay

## 2022-04-21 NOTE — Telephone Encounter (Signed)
Spoke with pt. Pt was notified of Doppler results. Will continue her current medication and follow up as planned.

## 2022-04-25 ENCOUNTER — Telehealth: Payer: Self-pay | Admitting: Cardiovascular Disease

## 2022-04-25 NOTE — Telephone Encounter (Signed)
Message routed to precert/auth team for assistance

## 2022-04-25 NOTE — Telephone Encounter (Signed)
Patient is calling because she her insurance is requiring a pre auth for her echo. Please advise

## 2022-04-28 ENCOUNTER — Ambulatory Visit (HOSPITAL_COMMUNITY): Payer: Medicare PPO

## 2022-04-29 DIAGNOSIS — M2560 Stiffness of unspecified joint, not elsewhere classified: Secondary | ICD-10-CM | POA: Diagnosis not present

## 2022-04-29 DIAGNOSIS — M6281 Muscle weakness (generalized): Secondary | ICD-10-CM | POA: Diagnosis not present

## 2022-04-29 DIAGNOSIS — M542 Cervicalgia: Secondary | ICD-10-CM | POA: Diagnosis not present

## 2022-05-06 ENCOUNTER — Ambulatory Visit (HOSPITAL_COMMUNITY): Payer: Medicare PPO | Attending: Cardiology

## 2022-05-06 DIAGNOSIS — R55 Syncope and collapse: Secondary | ICD-10-CM | POA: Insufficient documentation

## 2022-05-06 LAB — ECHOCARDIOGRAM COMPLETE
Area-P 1/2: 2.54 cm2
S' Lateral: 3.2 cm

## 2022-05-09 DIAGNOSIS — M2560 Stiffness of unspecified joint, not elsewhere classified: Secondary | ICD-10-CM | POA: Diagnosis not present

## 2022-05-09 DIAGNOSIS — M542 Cervicalgia: Secondary | ICD-10-CM | POA: Diagnosis not present

## 2022-05-09 DIAGNOSIS — M6281 Muscle weakness (generalized): Secondary | ICD-10-CM | POA: Diagnosis not present

## 2022-05-12 DIAGNOSIS — M6281 Muscle weakness (generalized): Secondary | ICD-10-CM | POA: Diagnosis not present

## 2022-05-12 DIAGNOSIS — M542 Cervicalgia: Secondary | ICD-10-CM | POA: Diagnosis not present

## 2022-05-12 DIAGNOSIS — M2560 Stiffness of unspecified joint, not elsewhere classified: Secondary | ICD-10-CM | POA: Diagnosis not present

## 2022-05-15 ENCOUNTER — Telehealth: Payer: Self-pay

## 2022-05-15 NOTE — Telephone Encounter (Signed)
Spoke with pt. Pt was notified of echocardiogram results. Pt voiced understanding. Pt will follow up with Dr. Gwenlyn Found. Apt with Diona Browner, NP was cancelled.

## 2022-05-16 DIAGNOSIS — M2560 Stiffness of unspecified joint, not elsewhere classified: Secondary | ICD-10-CM | POA: Diagnosis not present

## 2022-05-16 DIAGNOSIS — M6281 Muscle weakness (generalized): Secondary | ICD-10-CM | POA: Diagnosis not present

## 2022-05-16 DIAGNOSIS — M542 Cervicalgia: Secondary | ICD-10-CM | POA: Diagnosis not present

## 2022-05-20 DIAGNOSIS — M542 Cervicalgia: Secondary | ICD-10-CM | POA: Diagnosis not present

## 2022-05-20 DIAGNOSIS — M2560 Stiffness of unspecified joint, not elsewhere classified: Secondary | ICD-10-CM | POA: Diagnosis not present

## 2022-05-20 DIAGNOSIS — M6281 Muscle weakness (generalized): Secondary | ICD-10-CM | POA: Diagnosis not present

## 2022-05-23 DIAGNOSIS — M6281 Muscle weakness (generalized): Secondary | ICD-10-CM | POA: Diagnosis not present

## 2022-05-23 DIAGNOSIS — M2560 Stiffness of unspecified joint, not elsewhere classified: Secondary | ICD-10-CM | POA: Diagnosis not present

## 2022-05-23 DIAGNOSIS — M542 Cervicalgia: Secondary | ICD-10-CM | POA: Diagnosis not present

## 2022-05-28 IMAGING — CT CT ABD-PELV W/ CM
1 of 3 series · 12 of 32 positions shown, 18 images · IV contrast (APPLIED)
Comparison: No prior abdominal imaging.

CLINICAL DATA: 74-year-old female with unintentional weight loss.
History of breast cancer, bilateral mastectomy, left oophorectomy,
appendectomy.

EXAM:
CT ABDOMEN AND PELVIS WITH CONTRAST
TECHNIQUE: Multidetector CT imaging of the abdomen and pelvis was performed
using the standard protocol following bolus administration of
intravenous contrast.
CONTRAST:  100mL Q1IAF5-N88 IOPAMIDOL (Q1IAF5-N88) INJECTION 61%

[Series 2: abd/pelvis w/cm · axial · 0.69mm/px · z∈[-439,-59]mm · 12 of 90 slices shown, 18 images]
[im 7/90  soft-tissue]
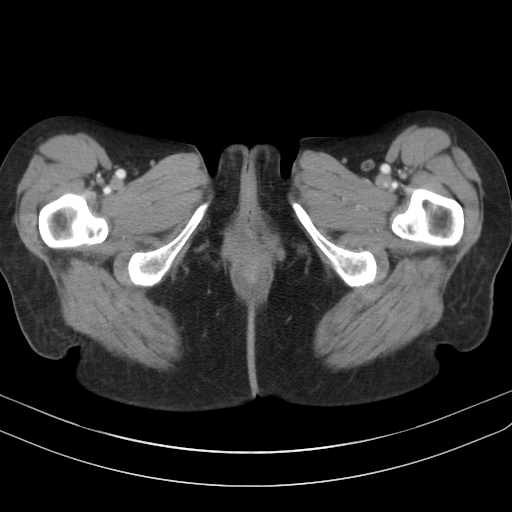
[im 7/90  bone]
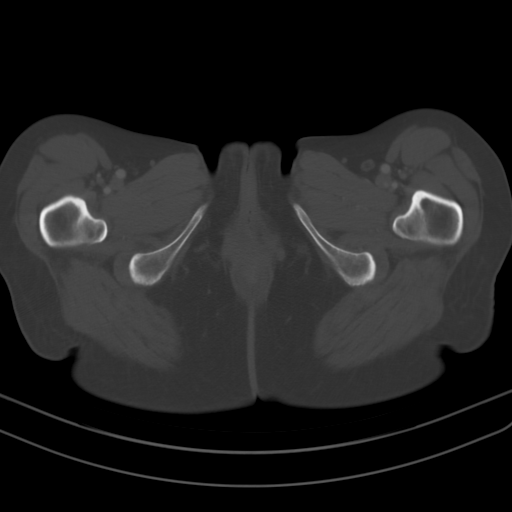
[im 13/90  soft-tissue]
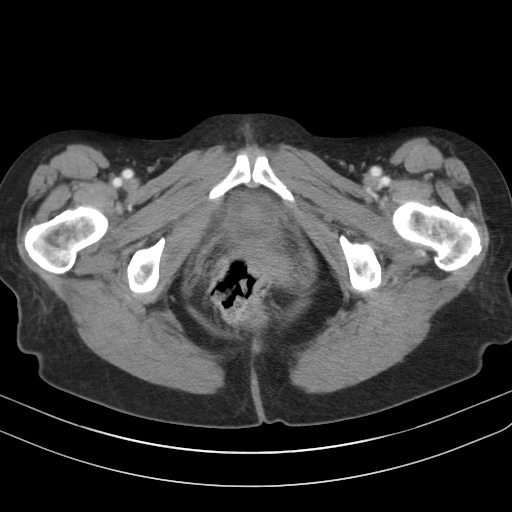
[im 20/90  soft-tissue]
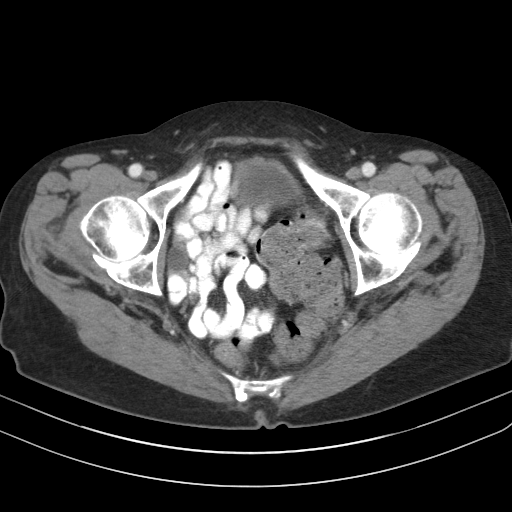
[im 26/90  soft-tissue]
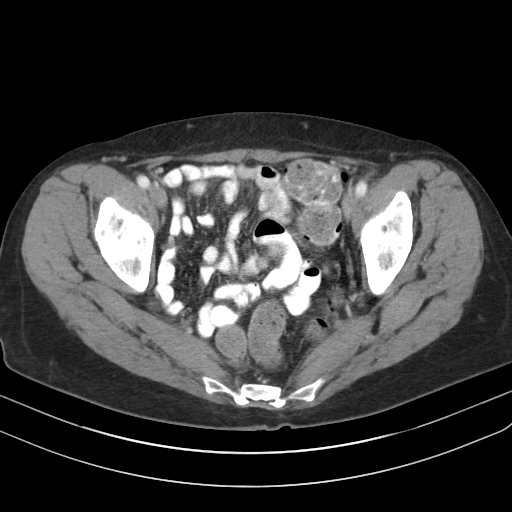
[im 32/90  soft-tissue]
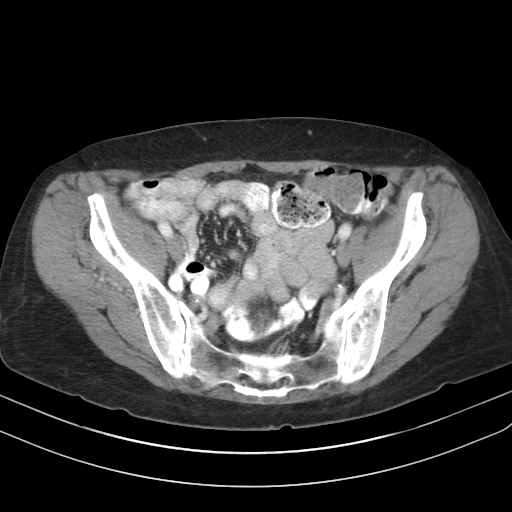
[im 39/90  soft-tissue]
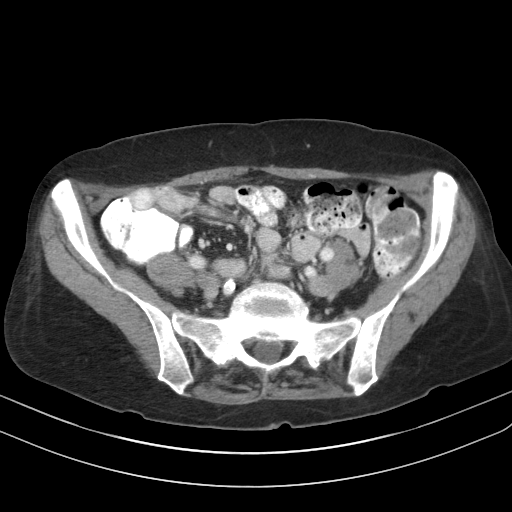
[im 51/90  soft-tissue]
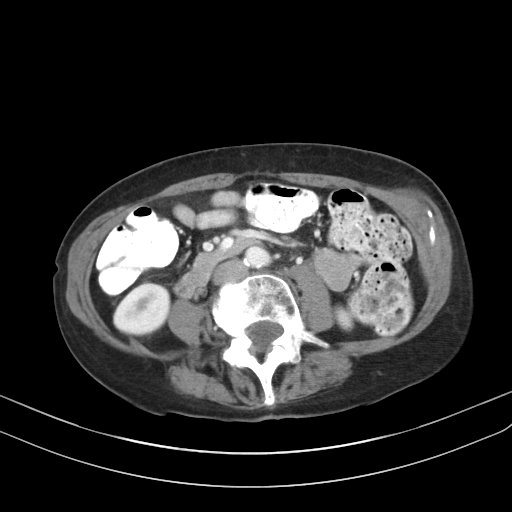
[im 58/90  soft-tissue]
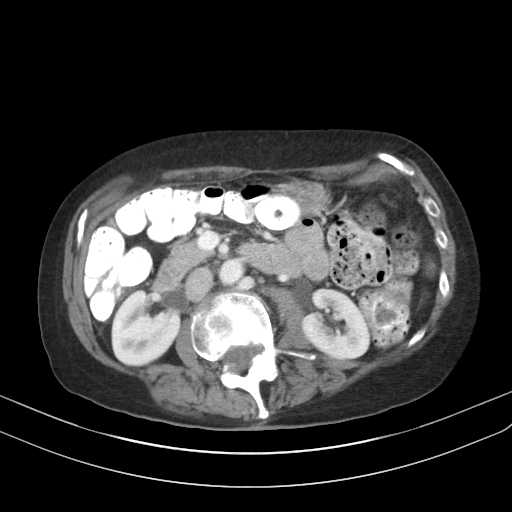
[im 64/90  soft-tissue]
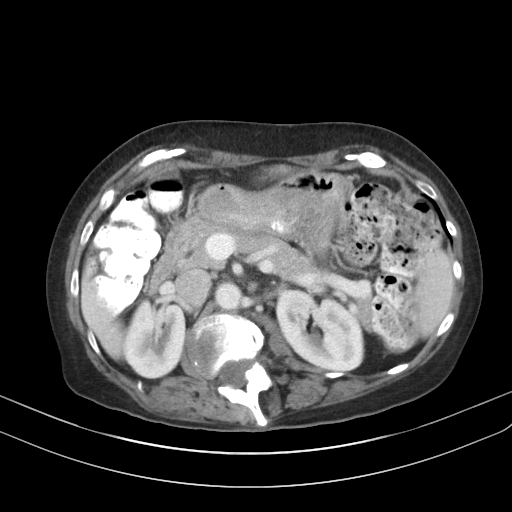
[im 64/90  lung]
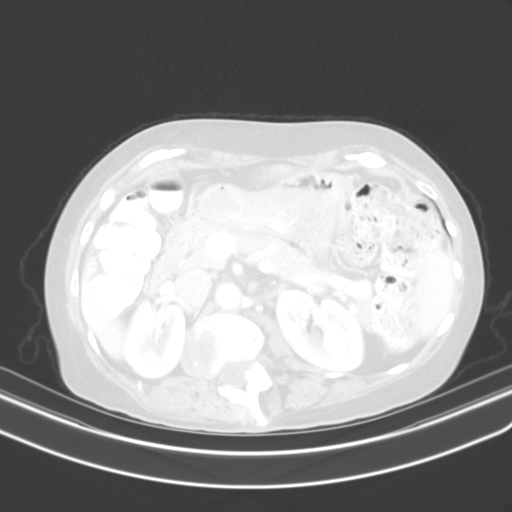
[im 64/90  bone]
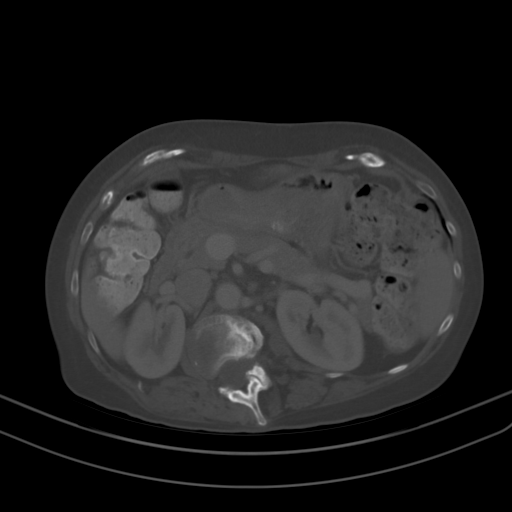
[im 70/90  soft-tissue]
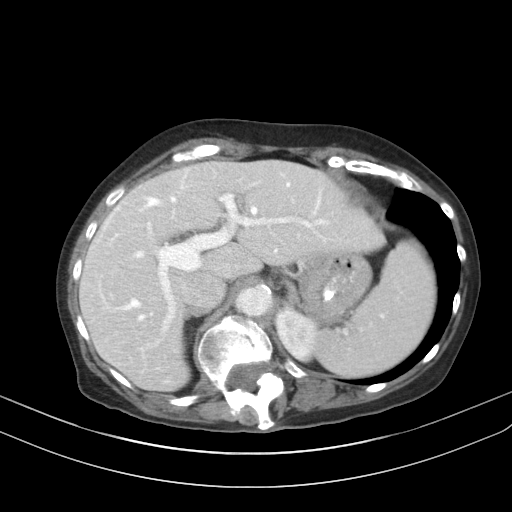
[im 70/90  lung]
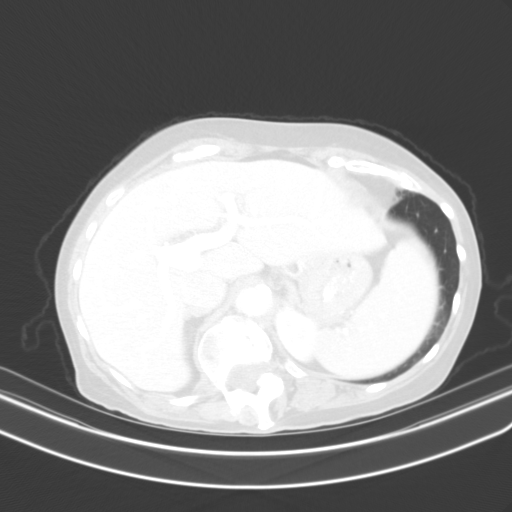
[im 77/90  soft-tissue]
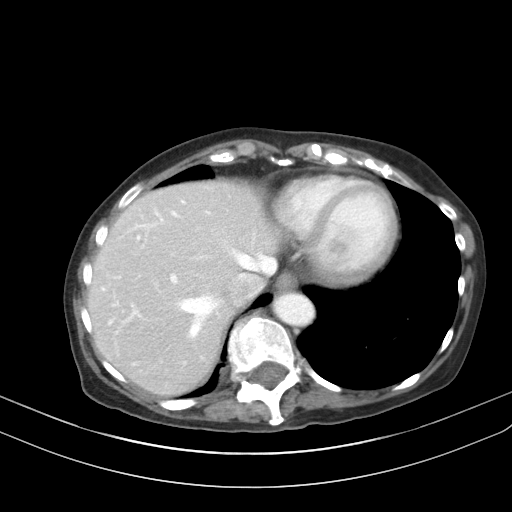
[im 77/90  lung]
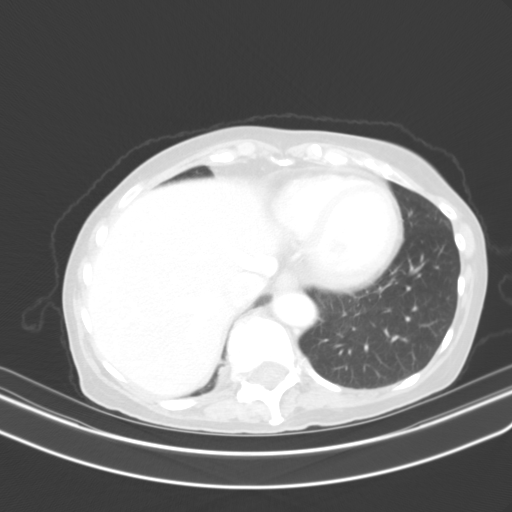
[im 83/90  soft-tissue]
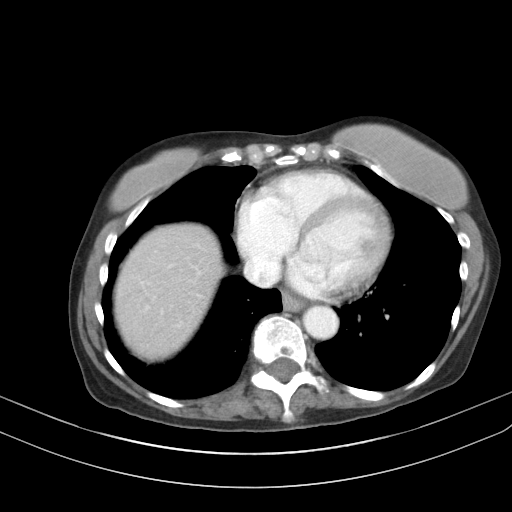
[im 83/90  lung]
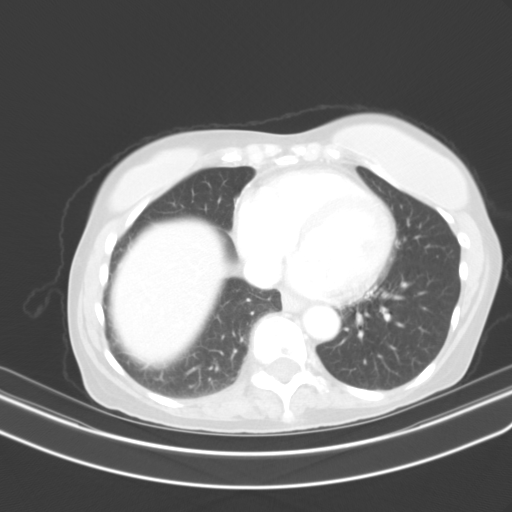

[12 of 32 positions shown; findings below may reference images not displayed]

FINDINGS: Lower chest: Mild lung base scarring or atelectasis. No
cardiomegaly. No pericardial or pleural effusion. Sequelae of breast
reconstruction.

Hepatobiliary: Negative liver and gallbladder. No bile duct
enlargement.

Pancreas: Negative.

Spleen: Negative.

Adrenals/Urinary Tract: Normal adrenal glands. Bilateral renal
enhancement and contrast excretion is symmetric and normal. Proximal
ureters appear normal. Diminutive and negative urinary bladder.

Stomach/Bowel: Retained stool throughout the large bowel. Oral
contrast has reached the proximal sigmoid colon. Redundant
transverse colon. No large bowel inflammation. Negative terminal
ileum. No dilated small bowel. Unremarkable stomach and duodenum. No
free air, free fluid, mesenteric stranding.

Vascular/Lymphatic: Aortoiliac calcified atherosclerosis. Major
arterial structures are patent. Portal venous system is patent. No
lymphadenopathy.

Reproductive: Normal right ovary along the right pelvic side wall
series 2, image 70. Otherwise surgically absent.

Other: No pelvic free fluid.

Musculoskeletal: Moderate to severe thoracolumbar scoliosis. Levels
of chronic severe disc degeneration and vacuum disc.

Additionally, bone mineralization is heterogeneous throughout the
visible spine, and there is suspicious lucency in the sacrum (left
sacral ala series 2, image 53). Superimposed benign sacral Tarlov
cysts. No overtly destructive bone lesion identified.
IMPRESSION: 1. Heterogeneous bone mineralization throughout the visible spine
and pelvis, raising the possibility of osseous metastatic disease in
this patient with unintentional weight loss and a history of breast
cancer.
Nuclear Medicine Whole-body Bone scan or alternatively Pelvis MRI
(without and with contrast) might best evaluate further.

2. Otherwise no acute or metastatic process identified in the
abdomen or pelvis.
Retained stool throughout the large bowel raising the possibility of
constipation.
Aortic Atherosclerosis (38QU7-O47.7).

## 2022-05-29 DIAGNOSIS — M2560 Stiffness of unspecified joint, not elsewhere classified: Secondary | ICD-10-CM | POA: Diagnosis not present

## 2022-05-29 DIAGNOSIS — M542 Cervicalgia: Secondary | ICD-10-CM | POA: Diagnosis not present

## 2022-05-29 DIAGNOSIS — M6281 Muscle weakness (generalized): Secondary | ICD-10-CM | POA: Diagnosis not present

## 2022-06-02 DIAGNOSIS — M542 Cervicalgia: Secondary | ICD-10-CM | POA: Diagnosis not present

## 2022-06-02 DIAGNOSIS — M6281 Muscle weakness (generalized): Secondary | ICD-10-CM | POA: Diagnosis not present

## 2022-06-02 DIAGNOSIS — M2560 Stiffness of unspecified joint, not elsewhere classified: Secondary | ICD-10-CM | POA: Diagnosis not present

## 2022-06-06 DIAGNOSIS — M542 Cervicalgia: Secondary | ICD-10-CM | POA: Diagnosis not present

## 2022-06-06 DIAGNOSIS — M6281 Muscle weakness (generalized): Secondary | ICD-10-CM | POA: Diagnosis not present

## 2022-06-06 DIAGNOSIS — M2560 Stiffness of unspecified joint, not elsewhere classified: Secondary | ICD-10-CM | POA: Diagnosis not present

## 2022-06-10 ENCOUNTER — Ambulatory Visit: Payer: Medicare PPO | Admitting: Nurse Practitioner

## 2022-06-10 ENCOUNTER — Telehealth: Payer: Self-pay

## 2022-06-10 DIAGNOSIS — R55 Syncope and collapse: Secondary | ICD-10-CM

## 2022-06-10 DIAGNOSIS — R0609 Other forms of dyspnea: Secondary | ICD-10-CM

## 2022-06-10 DIAGNOSIS — M6281 Muscle weakness (generalized): Secondary | ICD-10-CM | POA: Diagnosis not present

## 2022-06-10 DIAGNOSIS — M2560 Stiffness of unspecified joint, not elsewhere classified: Secondary | ICD-10-CM | POA: Diagnosis not present

## 2022-06-10 DIAGNOSIS — M542 Cervicalgia: Secondary | ICD-10-CM | POA: Diagnosis not present

## 2022-06-10 IMAGING — NM NM BONE WHOLE BODY
4 series · 4 of 4 positions shown · non-contrast
Comparison: 11/02/2020

CLINICAL DATA: Metastatic breast cancer, abnormal CT

EXAM:
NUCLEAR MEDICINE WHOLE BODY BONE SCAN
TECHNIQUE: Whole body anterior and posterior images were obtained approximately
3 hours after intravenous injection of radiopharmaceutical.
RADIOPHARMACEUTICALS:  20.7 mCi 8echnetium-33m MDP IV

[Series 1: wbr_bone_40 whole body · 2.66mm/px · 1 of 1 slices shown (1 of 2)]
[im 1/1]
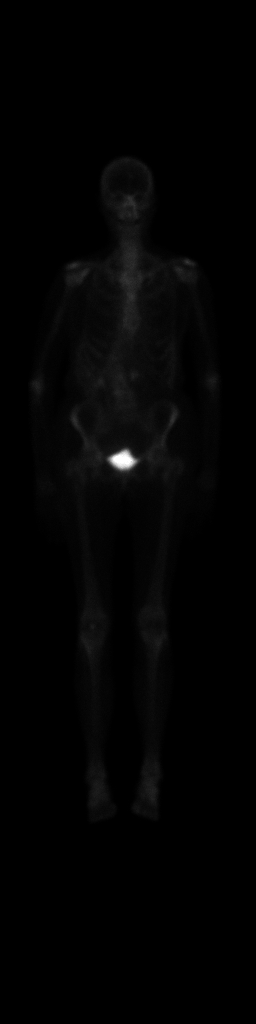

[Series 1: whole body · 2.66mm/px · 1 of 1 slices shown (1 of 2)]
[im 1/1]
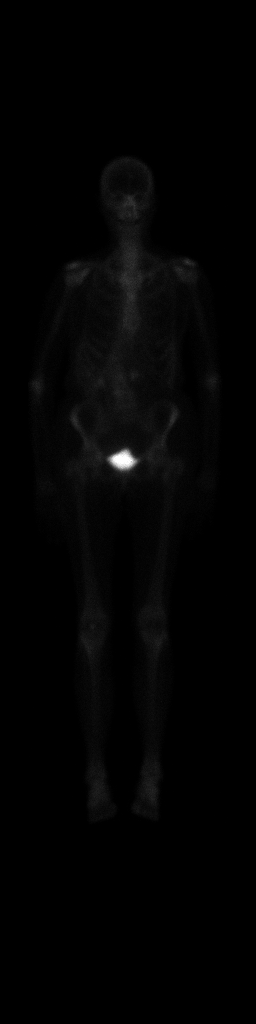

[Series 1: whole body · 2.66mm/px · 1 of 1 slices shown (2 of 2)]
[im 1/1]
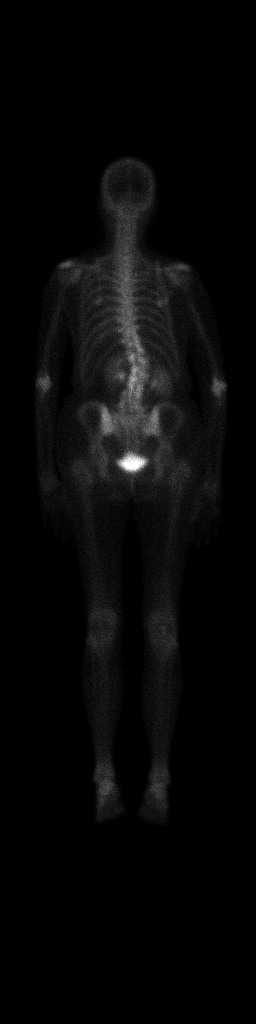

[Series 1: wbr_bone_40 whole body · 2.66mm/px · 1 of 1 slices shown (2 of 2)]
[im 1/1]
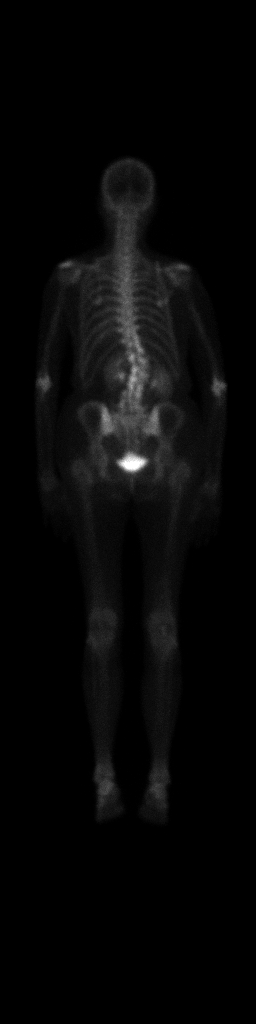

[4 of 4 positions shown; findings below may reference images not displayed]

FINDINGS: Anterior and posterior whole body planar images demonstrate normal
excretion of radiotracer within the kidneys and bladder. There is
right convex scoliosis of the lumbar spine. There is no focal
radiotracer uptake to suggest metastatic disease. Mild degenerative
type activity is seen at the bilateral shoulders, bilateral hips,
and right knee.
IMPRESSION: 1. No evidence of bony metastases.

## 2022-06-10 NOTE — Telephone Encounter (Signed)
Lmom to discuss monitor results. Referral sent to EP per Diona Browner, DNP.

## 2022-06-11 NOTE — Telephone Encounter (Signed)
Patient returned CMA's call to follow-up on results.

## 2022-06-11 NOTE — Telephone Encounter (Signed)
Shelby Sciara, NP  05/26/2022  5:13 PM EDT     Recent monitor showed 1 run of ventricular tachycardia (fast heartbeat originating from the bottom chambers of the heart) lasting approximately 19 beats, maximum heart rate 197 bpm. There were 13 runs of supraventricular tachycardia (fast heart beat originating from the chambers of the heart). Given your recent episode of passing out and sustained ventricular tachycardia, I would like to refer you to our electrophysiologists for discussion of further management. Thank you. -EM  Pt notified of above result, she has already made an appt with Dr Curt Bears.

## 2022-06-12 NOTE — Telephone Encounter (Signed)
Noted  

## 2022-06-17 DIAGNOSIS — M2560 Stiffness of unspecified joint, not elsewhere classified: Secondary | ICD-10-CM | POA: Diagnosis not present

## 2022-06-17 DIAGNOSIS — M542 Cervicalgia: Secondary | ICD-10-CM | POA: Diagnosis not present

## 2022-06-17 DIAGNOSIS — M6281 Muscle weakness (generalized): Secondary | ICD-10-CM | POA: Diagnosis not present

## 2022-06-19 DIAGNOSIS — M542 Cervicalgia: Secondary | ICD-10-CM | POA: Diagnosis not present

## 2022-06-19 DIAGNOSIS — M2560 Stiffness of unspecified joint, not elsewhere classified: Secondary | ICD-10-CM | POA: Diagnosis not present

## 2022-06-19 DIAGNOSIS — M6281 Muscle weakness (generalized): Secondary | ICD-10-CM | POA: Diagnosis not present

## 2022-06-27 ENCOUNTER — Encounter: Payer: Self-pay | Admitting: Cardiovascular Disease

## 2022-06-27 ENCOUNTER — Ambulatory Visit: Payer: Medicare PPO | Attending: Cardiovascular Disease | Admitting: Cardiovascular Disease

## 2022-06-27 VITALS — BP 116/68 | HR 64 | Ht 68.0 in | Wt 129.2 lb

## 2022-06-27 DIAGNOSIS — E782 Mixed hyperlipidemia: Secondary | ICD-10-CM | POA: Diagnosis not present

## 2022-06-27 DIAGNOSIS — R42 Dizziness and giddiness: Secondary | ICD-10-CM

## 2022-06-27 DIAGNOSIS — Z8249 Family history of ischemic heart disease and other diseases of the circulatory system: Secondary | ICD-10-CM | POA: Diagnosis not present

## 2022-06-27 NOTE — Progress Notes (Signed)
06/27/2022 Shelby [facility]   1946/03/18  [id number]  Primary Physician Shelby Cruel, MD Primary Cardiologist: Shelby Harp MD [facility], [place]  HPI:  Shelby Peterson is a 76 y.o.  mildly overweight, married Caucasian female, mother of 1 child, grandmother to 1 step-grandchild who is self referred for cardiovascular evaluation.  I last saw her in the office 06/08/2019.  She is accompanied by her husband Shelby [facility] today.  Her risk factor profile is positive for hyperlipidemia and family history. Her father had an MI at age 20. She is not diabetic, hypertensive nor does she smoke. She drinks occasional red wine. She has noticed increasing dyspnea on exertion and increasing fatigue as well as some back and shoulder pain.   Her past history otherwise is remarkable for having had breast cancer in 1999 and bilateral mastectomies, as well as an oophorectomy. She has a diagnosis of fibromyalgia and Sjogren syndrome. She has back pain and has had epidural injections by [facility] and [facility]. She also has interstitial cystitis followed by [facility]. She has been tried on Zetia in the past with little benefit and was worried about starting on a statin because of all of her aches and pains.    I performed a Myoview stress test on her December 2013 because of chest pain which was normal except for breast attenuation artifact. She was statin intolerant to her Crestor.   She apparently is statin intolerant and did not tolerate either Crestor or simvastatin.  We had a discussion concerning PCSK9 monoclonal inhibitors.    She is on Zetia but declined Repatha.   Since I saw her in the office 3 years ago she has been seen recently by Shelby Canner, NP for dizziness.  A 2D echo was normal and specifically did not show much valve prolapse.  An event monitor showed runs of NSVT and short runs of SVT.  She is unfortunately dealing with degenerative disc disease, arthritis and scoliosis and is  being evaluated at [facility] for this.     Current Meds  Medication Sig   acetaminophen (TYLENOL) 650 MG CR tablet Take 650 mg by mouth every 8 (eight) hours as needed for pain.   acyclovir (ZOVIRAX) 400 MG tablet Take 400 mg by mouth 2 (two) times daily as needed.    celecoxib (CELEBREX) 200 MG capsule Take by mouth.   Cholecalciferol (VITAMIN D-3) 5000 UNITS TABS Take 1 tablet by mouth once a week.   ezetimibe (ZETIA) 10 MG tablet Take 1 tablet by mouth daily.   gabapentin (NEURONTIN) 300 MG capsule SMARTSIG:2-3 Capsule(s) By Mouth Daily   HYDROcodone-Acetaminophen 10-325 MG/15ML SOLN Take 0.5 tablets by mouth daily as needed.   Melatonin 5 MG TABS Take 5 mg by mouth at bedtime.   Menaquinone-7 (VITAMIN K2) 100 MCG CAPS Take 1 tablet by mouth daily.   Multiple Vitamins-Minerals (PRESERVISION AREDS PO) Take 1 tablet by mouth daily.   RESTASIS 0.05 % ophthalmic emulsion    traZODone (DESYREL) 100 MG tablet Take 100 mg by mouth at bedtime.     No Known Allergies  Social History   Socioeconomic History   Marital status: Married    Spouse name: Not on file   Number of children: 1   Years of education: 16   Highest education level: Not on file  Occupational History   Occupation: Retired   Tobacco Use   Smoking status: Never   Smokeless tobacco: Never  Substance and Sexual  Activity   Alcohol use: Yes    Comment: 1 glass of wine a month    Drug use: No   Sexual activity: Not on file  Other Topics Concern   Not on file  Social History Narrative   Drinks 2 cups of coffee a day    Social Determinants of Health   Financial Resource Strain: Not on file  Food Insecurity: Not on file  Transportation Needs: Not on file  Physical Activity: Not on file  Stress: Not on file  Social Connections: Not on file  Intimate Partner Violence: Not on file     Review of Systems: General: negative for chills, fever, night sweats or weight changes.  Cardiovascular: negative for chest pain,  dyspnea on exertion, edema, orthopnea, palpitations, paroxysmal nocturnal dyspnea or shortness of breath Dermatological: negative for rash Respiratory: negative for cough or wheezing Urologic: negative for hematuria Abdominal: negative for nausea, vomiting, diarrhea, bright red blood per rectum, melena, or hematemesis Neurologic: negative for visual changes, syncope, or dizziness All other systems reviewed and are otherwise negative except as noted above.    Blood pressure 116/68, pulse 64, height '5\' 8"'$  (1.727 m), weight 129 lb 3.2 oz (58.6 kg), SpO2 96 %.  General appearance: alert and no distress Neck: no adenopathy, no carotid bruit, no JVD, supple, symmetrical, trachea midline, and thyroid not enlarged, symmetric, no tenderness/mass/nodules Lungs: clear to auscultation bilaterally Heart: regular rate and rhythm, S1, S2 normal, no murmur, click, rub or gallop Extremities: extremities normal, atraumatic, no cyanosis or edema Pulses: 2+ and symmetric Skin: Skin color, texture, turgor normal. No rashes or lesions Neurologic: Grossly normal  EKG not performed today  ASSESSMENT AND PLAN:   Hyperlipidemia History of hyperlipidemia intolerant to statin therapy on Zetia with lipid profile performed/20/23 revealing total cholesterol 242, LDL 115 HDL 78.  We had talked about Repatha in the past which she has declined.  I am going to get a coronary calcium score to further assess her risk in light of her family history of heart disease.  Family history of heart disease Father had MI at age 52  Dizziness Patient's had dizziness since April.  She had an episode of syncope where she found herself on the kitchen floor in the middle the night.  She saw Shelby Canner, NP who ordered a 2D echo that was essentially normal.  Specifically it did not show mitral valve prolapse which she says she had in the past.  Event monitor showed a run of NSVT and several runs of SVT which I do not think are  contributing to her dizziness although she was made a appointment to see electrophysiology.  I think we can hold off on this for the time being and have her follow-up with ENT.     Shelby Harp MD FACP,FACC,FAHA, [place] [facility] 06/27/2022 9:23 AM

## 2022-06-27 NOTE — Assessment & Plan Note (Signed)
History of hyperlipidemia intolerant to statin therapy on Zetia with lipid profile performed/20/23 revealing total cholesterol 242, LDL 115 HDL 78.  We had talked about Repatha in the past which she has declined.  I am going to get a coronary calcium score to further assess her risk in light of her family history of heart disease.

## 2022-06-27 NOTE — Assessment & Plan Note (Signed)
Patient's had dizziness since April.  She had an episode of syncope where she found herself on the kitchen floor in the middle the night.  She saw Florence Canner, NP who ordered a 2D echo that was essentially normal.  Specifically it did not show mitral valve prolapse which she says she had in the past.  Event monitor showed a run of NSVT and several runs of SVT which I do not think are contributing to her dizziness although she was made a appointment to see electrophysiology.  I think we can hold off on this for the time being and have her follow-up with ENT.

## 2022-06-27 NOTE — Patient Instructions (Signed)
Medication Instructions:  Your physician recommends that you continue on your current medications as directed. Please refer to the Current Medication list given to you today.  *If you need a refill on your cardiac medications before your next appointment, please call your pharmacy*   Testing/Procedures: Dr. Berry has ordered a CT coronary calcium score.   Test locations:   [facility] ([address], [address] Floor)  This is $99 out of pocket.   Coronary CalciumScan A coronary calcium scan is an imaging test used to look for deposits of calcium and other fatty materials (plaques) in the inner lining of the blood vessels of the heart (coronary arteries). These deposits of calcium and plaques can partly clog and narrow the coronary arteries without producing any symptoms or warning signs. This puts a person at risk for a heart attack. This test can detect these deposits before symptoms develop. Tell a health care provider about: Any allergies you have. All medicines you are taking, including vitamins, herbs, eye drops, creams, and over-the-counter medicines. Any problems you or family members have had with anesthetic medicines. Any blood disorders you have. Any surgeries you have had. Any medical conditions you have. Whether you are pregnant or may be pregnant. What are the risks? Generally, this is a safe procedure. However, problems may occur, including: Harm to a pregnant woman and her unborn baby. This test involves the use of radiation. Radiation exposure can be dangerous to a pregnant woman and her unborn baby. If you are pregnant, you generally should not have this procedure done. Slight increase in the risk of cancer. This is because of the radiation involved in the test. What happens before the procedure? No preparation is needed for this procedure. What happens during the procedure? You will undress and remove any jewelry around your neck or chest. You will put  on a hospital gown. Sticky electrodes will be placed on your chest. The electrodes will be connected to an electrocardiogram (ECG) machine to record a tracing of the electrical activity of your heart. A CT scanner will take pictures of your heart. During this time, you will be asked to lie still and hold your breath for 2-3 seconds while a picture of your heart is being taken. The procedure may vary among health care providers and hospitals. What happens after the procedure? You can get dressed. You can return to your normal activities. It is up to you to get the results of your test. Ask your health care provider, or the department that is doing the test, when your results will be ready. Summary A coronary calcium scan is an imaging test used to look for deposits of calcium and other fatty materials (plaques) in the inner lining of the blood vessels of the heart (coronary arteries). Generally, this is a safe procedure. Tell your health care provider if you are pregnant or may be pregnant. No preparation is needed for this procedure. A CT scanner will take pictures of your heart. You can return to your normal activities after the scan is done. This information is not intended to replace advice given to you by your health care provider. Make sure you discuss any questions you have with your health care provider. Document Released: 04/03/2008 Document Revised: 08/25/2016 Document Reviewed: 08/25/2016 Elsevier Interactive Patient Education  2017 Elsevier Inc.    Follow-Up: At [facility], you and your health needs are our priority.  As part of our continuing mission to provide you with exceptional heart care, we   have created designated Provider Care Teams.  These Care Teams include your primary Cardiologist (physician) and Advanced Practice Providers (APPs -  Physician Assistants and Nurse Practitioners) who all work together to provide you with the care you need, when you need it.  We  recommend signing up for the patient portal called "MyChart".  Sign up information is provided on this After Visit Summary.  MyChart is used to connect with patients for Virtual Visits (Telemedicine).  Patients are able to view lab/test results, encounter notes, upcoming appointments, etc.  Non-urgent messages can be sent to your provider as well.   To learn more about what you can do with MyChart, go to [email or web address].    Your next appointment:   12 month(s)  The format for your next appointment:   In Person  Provider:   Jonathan Berry, MD 

## 2022-06-27 NOTE — Assessment & Plan Note (Signed)
Father had MI at age 75

## 2022-07-03 DIAGNOSIS — M6281 Muscle weakness (generalized): Secondary | ICD-10-CM | POA: Diagnosis not present

## 2022-07-03 DIAGNOSIS — M542 Cervicalgia: Secondary | ICD-10-CM | POA: Diagnosis not present

## 2022-07-03 DIAGNOSIS — M2560 Stiffness of unspecified joint, not elsewhere classified: Secondary | ICD-10-CM | POA: Diagnosis not present

## 2022-07-16 ENCOUNTER — Ambulatory Visit (HOSPITAL_BASED_OUTPATIENT_CLINIC_OR_DEPARTMENT_OTHER)
Admission: RE | Admit: 2022-07-16 | Discharge: 2022-07-16 | Disposition: A | Discharge status: Home / Self Care | Payer: Medicare PPO | Source: Ambulatory Visit | Attending: Cardiovascular Disease | Admitting: Cardiovascular Disease

## 2022-07-16 DIAGNOSIS — E782 Mixed hyperlipidemia: Secondary | ICD-10-CM | POA: Insufficient documentation

## 2022-07-21 ENCOUNTER — Encounter: Payer: Self-pay | Admitting: Cardiovascular Disease

## 2022-07-28 ENCOUNTER — Institutional Professional Consult (permissible substitution): Payer: Medicare PPO | Admitting: Cardiology

## 2022-08-04 DIAGNOSIS — M50221 Other cervical disc displacement at C4-C5 level: Secondary | ICD-10-CM | POA: Diagnosis not present

## 2022-08-04 DIAGNOSIS — M4802 Spinal stenosis, cervical region: Secondary | ICD-10-CM | POA: Diagnosis not present

## 2022-08-04 DIAGNOSIS — M542 Cervicalgia: Secondary | ICD-10-CM | POA: Diagnosis not present

## 2022-08-04 DIAGNOSIS — M50321 Other cervical disc degeneration at C4-C5 level: Secondary | ICD-10-CM | POA: Diagnosis not present

## 2022-08-05 DIAGNOSIS — M5030 Other cervical disc degeneration, unspecified cervical region: Secondary | ICD-10-CM | POA: Diagnosis not present

## 2022-08-05 DIAGNOSIS — M47816 Spondylosis without myelopathy or radiculopathy, lumbar region: Secondary | ICD-10-CM | POA: Diagnosis not present

## 2022-08-05 DIAGNOSIS — M47812 Spondylosis without myelopathy or radiculopathy, cervical region: Secondary | ICD-10-CM | POA: Diagnosis not present

## 2022-08-05 DIAGNOSIS — M5136 Other intervertebral disc degeneration, lumbar region: Secondary | ICD-10-CM | POA: Diagnosis not present

## 2022-08-05 DIAGNOSIS — M5134 Other intervertebral disc degeneration, thoracic region: Secondary | ICD-10-CM | POA: Diagnosis not present

## 2022-08-05 DIAGNOSIS — M170 Bilateral primary osteoarthritis of knee: Secondary | ICD-10-CM | POA: Diagnosis not present

## 2022-08-05 DIAGNOSIS — M2170 Unequal limb length (acquired), unspecified site: Secondary | ICD-10-CM | POA: Diagnosis not present

## 2022-08-05 DIAGNOSIS — M419 Scoliosis, unspecified: Secondary | ICD-10-CM | POA: Diagnosis not present

## 2022-08-25 DIAGNOSIS — M47816 Spondylosis without myelopathy or radiculopathy, lumbar region: Secondary | ICD-10-CM | POA: Diagnosis not present

## 2022-09-08 DIAGNOSIS — M47816 Spondylosis without myelopathy or radiculopathy, lumbar region: Secondary | ICD-10-CM | POA: Diagnosis not present

## 2022-09-22 DIAGNOSIS — M47816 Spondylosis without myelopathy or radiculopathy, lumbar region: Secondary | ICD-10-CM | POA: Diagnosis not present

## 2022-09-22 DIAGNOSIS — M47817 Spondylosis without myelopathy or radiculopathy, lumbosacral region: Secondary | ICD-10-CM | POA: Diagnosis not present

## 2022-10-09 DIAGNOSIS — L400 Psoriasis vulgaris: Secondary | ICD-10-CM | POA: Diagnosis not present

## 2022-10-09 DIAGNOSIS — D045 Carcinoma in situ of skin of trunk: Secondary | ICD-10-CM | POA: Diagnosis not present

## 2022-10-09 DIAGNOSIS — D485 Neoplasm of uncertain behavior of skin: Secondary | ICD-10-CM | POA: Diagnosis not present

## 2022-12-03 DIAGNOSIS — R42 Dizziness and giddiness: Secondary | ICD-10-CM | POA: Diagnosis not present

## 2022-12-03 DIAGNOSIS — Z6820 Body mass index (BMI) 20.0-20.9, adult: Secondary | ICD-10-CM | POA: Diagnosis not present

## 2023-02-04 DIAGNOSIS — R200 Anesthesia of skin: Secondary | ICD-10-CM | POA: Diagnosis not present

## 2023-02-04 DIAGNOSIS — M5030 Other cervical disc degeneration, unspecified cervical region: Secondary | ICD-10-CM | POA: Diagnosis not present

## 2023-02-04 DIAGNOSIS — M549 Dorsalgia, unspecified: Secondary | ICD-10-CM | POA: Diagnosis not present

## 2023-02-04 DIAGNOSIS — M542 Cervicalgia: Secondary | ICD-10-CM | POA: Diagnosis not present

## 2023-02-05 DIAGNOSIS — Z86010 Personal history of colonic polyps: Secondary | ICD-10-CM | POA: Diagnosis not present

## 2023-02-05 DIAGNOSIS — K5901 Slow transit constipation: Secondary | ICD-10-CM | POA: Diagnosis not present

## 2023-02-10 DIAGNOSIS — M5030 Other cervical disc degeneration, unspecified cervical region: Secondary | ICD-10-CM | POA: Diagnosis not present

## 2023-02-10 DIAGNOSIS — M5134 Other intervertebral disc degeneration, thoracic region: Secondary | ICD-10-CM | POA: Diagnosis not present

## 2023-02-10 DIAGNOSIS — M419 Scoliosis, unspecified: Secondary | ICD-10-CM | POA: Diagnosis not present

## 2023-02-10 DIAGNOSIS — M5136 Other intervertebral disc degeneration, lumbar region: Secondary | ICD-10-CM | POA: Diagnosis not present

## 2023-02-10 DIAGNOSIS — M160 Bilateral primary osteoarthritis of hip: Secondary | ICD-10-CM | POA: Diagnosis not present

## 2023-02-16 DIAGNOSIS — Z0000 Encounter for general adult medical examination without abnormal findings: Secondary | ICD-10-CM | POA: Diagnosis not present

## 2023-02-16 DIAGNOSIS — E538 Deficiency of other specified B group vitamins: Secondary | ICD-10-CM | POA: Diagnosis not present

## 2023-02-16 DIAGNOSIS — E559 Vitamin D deficiency, unspecified: Secondary | ICD-10-CM | POA: Diagnosis not present

## 2023-02-16 DIAGNOSIS — R7303 Prediabetes: Secondary | ICD-10-CM | POA: Diagnosis not present

## 2023-02-16 DIAGNOSIS — Z6820 Body mass index (BMI) 20.0-20.9, adult: Secondary | ICD-10-CM | POA: Diagnosis not present

## 2023-02-18 DIAGNOSIS — L409 Psoriasis, unspecified: Secondary | ICD-10-CM | POA: Diagnosis not present

## 2023-02-18 DIAGNOSIS — R7303 Prediabetes: Secondary | ICD-10-CM | POA: Diagnosis not present

## 2023-02-18 DIAGNOSIS — M810 Age-related osteoporosis without current pathological fracture: Secondary | ICD-10-CM | POA: Diagnosis not present

## 2023-02-18 DIAGNOSIS — M3500 Sicca syndrome, unspecified: Secondary | ICD-10-CM | POA: Diagnosis not present

## 2023-02-18 DIAGNOSIS — E559 Vitamin D deficiency, unspecified: Secondary | ICD-10-CM | POA: Diagnosis not present

## 2023-02-18 DIAGNOSIS — E7800 Pure hypercholesterolemia, unspecified: Secondary | ICD-10-CM | POA: Diagnosis not present

## 2023-02-18 DIAGNOSIS — E538 Deficiency of other specified B group vitamins: Secondary | ICD-10-CM | POA: Diagnosis not present

## 2023-02-18 DIAGNOSIS — G479 Sleep disorder, unspecified: Secondary | ICD-10-CM | POA: Diagnosis not present

## 2023-02-18 DIAGNOSIS — Z0000 Encounter for general adult medical examination without abnormal findings: Secondary | ICD-10-CM | POA: Diagnosis not present

## 2023-02-26 DIAGNOSIS — M542 Cervicalgia: Secondary | ICD-10-CM | POA: Diagnosis not present

## 2023-03-02 DIAGNOSIS — M542 Cervicalgia: Secondary | ICD-10-CM | POA: Diagnosis not present

## 2023-03-03 DIAGNOSIS — M542 Cervicalgia: Secondary | ICD-10-CM | POA: Diagnosis not present

## 2023-03-10 DIAGNOSIS — M542 Cervicalgia: Secondary | ICD-10-CM | POA: Diagnosis not present

## 2023-03-17 DIAGNOSIS — M542 Cervicalgia: Secondary | ICD-10-CM | POA: Diagnosis not present

## 2023-03-19 DIAGNOSIS — M542 Cervicalgia: Secondary | ICD-10-CM | POA: Diagnosis not present

## 2023-03-24 DIAGNOSIS — Z86010 Personal history of colonic polyps: Secondary | ICD-10-CM | POA: Diagnosis not present

## 2023-03-24 DIAGNOSIS — Z09 Encounter for follow-up examination after completed treatment for conditions other than malignant neoplasm: Secondary | ICD-10-CM | POA: Diagnosis not present

## 2023-03-24 DIAGNOSIS — K529 Noninfective gastroenteritis and colitis, unspecified: Secondary | ICD-10-CM | POA: Diagnosis not present

## 2023-03-24 DIAGNOSIS — K5730 Diverticulosis of large intestine without perforation or abscess without bleeding: Secondary | ICD-10-CM | POA: Diagnosis not present

## 2023-03-25 DIAGNOSIS — M542 Cervicalgia: Secondary | ICD-10-CM | POA: Diagnosis not present

## 2023-03-26 DIAGNOSIS — K529 Noninfective gastroenteritis and colitis, unspecified: Secondary | ICD-10-CM | POA: Diagnosis not present

## 2023-03-31 DIAGNOSIS — M542 Cervicalgia: Secondary | ICD-10-CM | POA: Diagnosis not present

## 2023-04-01 DIAGNOSIS — Z6821 Body mass index (BMI) 21.0-21.9, adult: Secondary | ICD-10-CM | POA: Diagnosis not present

## 2023-04-01 DIAGNOSIS — R300 Dysuria: Secondary | ICD-10-CM | POA: Diagnosis not present

## 2023-04-01 DIAGNOSIS — Z0000 Encounter for general adult medical examination without abnormal findings: Secondary | ICD-10-CM | POA: Diagnosis not present

## 2023-04-01 DIAGNOSIS — N390 Urinary tract infection, site not specified: Secondary | ICD-10-CM | POA: Diagnosis not present

## 2023-04-21 DIAGNOSIS — M542 Cervicalgia: Secondary | ICD-10-CM | POA: Diagnosis not present

## 2023-04-28 DIAGNOSIS — M542 Cervicalgia: Secondary | ICD-10-CM | POA: Diagnosis not present

## 2023-05-05 DIAGNOSIS — M542 Cervicalgia: Secondary | ICD-10-CM | POA: Diagnosis not present

## 2023-06-22 DIAGNOSIS — Z03818 Encounter for observation for suspected exposure to other biological agents ruled out: Secondary | ICD-10-CM | POA: Diagnosis not present

## 2023-06-22 DIAGNOSIS — H66002 Acute suppurative otitis media without spontaneous rupture of ear drum, left ear: Secondary | ICD-10-CM | POA: Diagnosis not present

## 2023-06-22 DIAGNOSIS — R509 Fever, unspecified: Secondary | ICD-10-CM | POA: Diagnosis not present

## 2023-06-22 DIAGNOSIS — R0981 Nasal congestion: Secondary | ICD-10-CM | POA: Diagnosis not present

## 2023-06-22 DIAGNOSIS — H6501 Acute serous otitis media, right ear: Secondary | ICD-10-CM | POA: Diagnosis not present

## 2023-06-25 DIAGNOSIS — R051 Acute cough: Secondary | ICD-10-CM | POA: Diagnosis not present

## 2023-06-25 DIAGNOSIS — H6692 Otitis media, unspecified, left ear: Secondary | ICD-10-CM | POA: Diagnosis not present

## 2023-06-25 DIAGNOSIS — J029 Acute pharyngitis, unspecified: Secondary | ICD-10-CM | POA: Diagnosis not present

## 2023-06-25 DIAGNOSIS — R0981 Nasal congestion: Secondary | ICD-10-CM | POA: Diagnosis not present

## 2023-08-25 DIAGNOSIS — M419 Scoliosis, unspecified: Secondary | ICD-10-CM | POA: Diagnosis not present

## 2023-08-25 DIAGNOSIS — M5030 Other cervical disc degeneration, unspecified cervical region: Secondary | ICD-10-CM | POA: Diagnosis not present

## 2023-08-25 DIAGNOSIS — M5106 Intervertebral disc disorders with myelopathy, lumbar region: Secondary | ICD-10-CM | POA: Diagnosis not present

## 2023-08-25 DIAGNOSIS — M5134 Other intervertebral disc degeneration, thoracic region: Secondary | ICD-10-CM | POA: Diagnosis not present

## 2023-08-25 DIAGNOSIS — M810 Age-related osteoporosis without current pathological fracture: Secondary | ICD-10-CM | POA: Diagnosis not present

## 2023-08-26 DIAGNOSIS — M810 Age-related osteoporosis without current pathological fracture: Secondary | ICD-10-CM | POA: Diagnosis not present

## 2023-09-16 DIAGNOSIS — L400 Psoriasis vulgaris: Secondary | ICD-10-CM | POA: Diagnosis not present

## 2023-09-16 DIAGNOSIS — L720 Epidermal cyst: Secondary | ICD-10-CM | POA: Diagnosis not present

## 2023-09-16 DIAGNOSIS — L905 Scar conditions and fibrosis of skin: Secondary | ICD-10-CM | POA: Diagnosis not present

## 2023-09-16 DIAGNOSIS — Z85828 Personal history of other malignant neoplasm of skin: Secondary | ICD-10-CM | POA: Diagnosis not present

## 2023-10-16 DIAGNOSIS — M542 Cervicalgia: Secondary | ICD-10-CM | POA: Diagnosis not present

## 2023-10-16 DIAGNOSIS — M545 Low back pain, unspecified: Secondary | ICD-10-CM | POA: Diagnosis not present

## 2023-10-16 DIAGNOSIS — M5459 Other low back pain: Secondary | ICD-10-CM | POA: Diagnosis not present

## 2023-10-19 DIAGNOSIS — M810 Age-related osteoporosis without current pathological fracture: Secondary | ICD-10-CM | POA: Diagnosis not present

## 2023-10-19 DIAGNOSIS — M419 Scoliosis, unspecified: Secondary | ICD-10-CM | POA: Diagnosis not present

## 2023-10-22 DIAGNOSIS — M62838 Other muscle spasm: Secondary | ICD-10-CM | POA: Diagnosis not present

## 2023-10-22 DIAGNOSIS — Z6820 Body mass index (BMI) 20.0-20.9, adult: Secondary | ICD-10-CM | POA: Diagnosis not present

## 2023-10-22 DIAGNOSIS — M549 Dorsalgia, unspecified: Secondary | ICD-10-CM | POA: Diagnosis not present

## 2023-10-22 DIAGNOSIS — G4700 Insomnia, unspecified: Secondary | ICD-10-CM | POA: Diagnosis not present

## 2023-10-22 DIAGNOSIS — E785 Hyperlipidemia, unspecified: Secondary | ICD-10-CM | POA: Diagnosis not present

## 2023-10-22 DIAGNOSIS — M810 Age-related osteoporosis without current pathological fracture: Secondary | ICD-10-CM | POA: Diagnosis not present

## 2023-10-27 DIAGNOSIS — M542 Cervicalgia: Secondary | ICD-10-CM | POA: Diagnosis not present

## 2023-10-27 DIAGNOSIS — M5459 Other low back pain: Secondary | ICD-10-CM | POA: Diagnosis not present

## 2023-10-27 DIAGNOSIS — M545 Low back pain, unspecified: Secondary | ICD-10-CM | POA: Diagnosis not present

## 2023-11-03 DIAGNOSIS — M5459 Other low back pain: Secondary | ICD-10-CM | POA: Diagnosis not present

## 2023-11-03 DIAGNOSIS — M545 Low back pain, unspecified: Secondary | ICD-10-CM | POA: Diagnosis not present

## 2023-11-03 DIAGNOSIS — M542 Cervicalgia: Secondary | ICD-10-CM | POA: Diagnosis not present

## 2023-11-06 DIAGNOSIS — M545 Low back pain, unspecified: Secondary | ICD-10-CM | POA: Diagnosis not present

## 2023-11-06 DIAGNOSIS — M542 Cervicalgia: Secondary | ICD-10-CM | POA: Diagnosis not present

## 2023-11-06 DIAGNOSIS — M5459 Other low back pain: Secondary | ICD-10-CM | POA: Diagnosis not present

## 2023-11-09 DIAGNOSIS — M5459 Other low back pain: Secondary | ICD-10-CM | POA: Diagnosis not present

## 2023-11-09 DIAGNOSIS — M545 Low back pain, unspecified: Secondary | ICD-10-CM | POA: Diagnosis not present

## 2023-11-09 DIAGNOSIS — M542 Cervicalgia: Secondary | ICD-10-CM | POA: Diagnosis not present

## 2023-11-13 DIAGNOSIS — M419 Scoliosis, unspecified: Secondary | ICD-10-CM | POA: Diagnosis not present

## 2023-11-13 DIAGNOSIS — M47816 Spondylosis without myelopathy or radiculopathy, lumbar region: Secondary | ICD-10-CM | POA: Diagnosis not present

## 2023-11-19 DIAGNOSIS — L400 Psoriasis vulgaris: Secondary | ICD-10-CM | POA: Diagnosis not present

## 2023-11-19 DIAGNOSIS — M545 Low back pain, unspecified: Secondary | ICD-10-CM | POA: Diagnosis not present

## 2023-11-19 DIAGNOSIS — L820 Inflamed seborrheic keratosis: Secondary | ICD-10-CM | POA: Diagnosis not present

## 2023-11-19 DIAGNOSIS — M5459 Other low back pain: Secondary | ICD-10-CM | POA: Diagnosis not present

## 2023-11-19 DIAGNOSIS — M542 Cervicalgia: Secondary | ICD-10-CM | POA: Diagnosis not present

## 2023-11-19 DIAGNOSIS — D225 Melanocytic nevi of trunk: Secondary | ICD-10-CM | POA: Diagnosis not present

## 2023-11-19 DIAGNOSIS — L905 Scar conditions and fibrosis of skin: Secondary | ICD-10-CM | POA: Diagnosis not present

## 2023-11-19 DIAGNOSIS — Z85828 Personal history of other malignant neoplasm of skin: Secondary | ICD-10-CM | POA: Diagnosis not present

## 2023-11-19 DIAGNOSIS — Z79899 Other long term (current) drug therapy: Secondary | ICD-10-CM | POA: Diagnosis not present

## 2023-11-24 DIAGNOSIS — L400 Psoriasis vulgaris: Secondary | ICD-10-CM | POA: Diagnosis not present

## 2023-12-03 DIAGNOSIS — M5459 Other low back pain: Secondary | ICD-10-CM | POA: Diagnosis not present

## 2023-12-03 DIAGNOSIS — M542 Cervicalgia: Secondary | ICD-10-CM | POA: Diagnosis not present

## 2023-12-03 DIAGNOSIS — M545 Low back pain, unspecified: Secondary | ICD-10-CM | POA: Diagnosis not present

## 2023-12-14 DIAGNOSIS — M545 Low back pain, unspecified: Secondary | ICD-10-CM | POA: Diagnosis not present

## 2023-12-14 DIAGNOSIS — M5459 Other low back pain: Secondary | ICD-10-CM | POA: Diagnosis not present

## 2023-12-14 DIAGNOSIS — M542 Cervicalgia: Secondary | ICD-10-CM | POA: Diagnosis not present

## 2023-12-21 DIAGNOSIS — M5459 Other low back pain: Secondary | ICD-10-CM | POA: Diagnosis not present

## 2023-12-21 DIAGNOSIS — M545 Low back pain, unspecified: Secondary | ICD-10-CM | POA: Diagnosis not present

## 2023-12-21 DIAGNOSIS — M542 Cervicalgia: Secondary | ICD-10-CM | POA: Diagnosis not present

## 2023-12-21 DIAGNOSIS — L400 Psoriasis vulgaris: Secondary | ICD-10-CM | POA: Diagnosis not present

## 2024-01-01 DIAGNOSIS — M542 Cervicalgia: Secondary | ICD-10-CM | POA: Diagnosis not present

## 2024-01-01 DIAGNOSIS — M545 Low back pain, unspecified: Secondary | ICD-10-CM | POA: Diagnosis not present

## 2024-01-01 DIAGNOSIS — M5459 Other low back pain: Secondary | ICD-10-CM | POA: Diagnosis not present

## 2024-01-11 DIAGNOSIS — M545 Low back pain, unspecified: Secondary | ICD-10-CM | POA: Diagnosis not present

## 2024-01-11 DIAGNOSIS — M542 Cervicalgia: Secondary | ICD-10-CM | POA: Diagnosis not present

## 2024-01-11 DIAGNOSIS — M5459 Other low back pain: Secondary | ICD-10-CM | POA: Diagnosis not present

## 2024-01-22 DIAGNOSIS — M545 Low back pain, unspecified: Secondary | ICD-10-CM | POA: Diagnosis not present

## 2024-01-22 DIAGNOSIS — M5459 Other low back pain: Secondary | ICD-10-CM | POA: Diagnosis not present

## 2024-01-22 DIAGNOSIS — M542 Cervicalgia: Secondary | ICD-10-CM | POA: Diagnosis not present

## 2024-02-22 DIAGNOSIS — L400 Psoriasis vulgaris: Secondary | ICD-10-CM | POA: Diagnosis not present

## 2024-03-03 DIAGNOSIS — Z79899 Other long term (current) drug therapy: Secondary | ICD-10-CM | POA: Diagnosis not present

## 2024-03-03 DIAGNOSIS — E785 Hyperlipidemia, unspecified: Secondary | ICD-10-CM | POA: Diagnosis not present

## 2024-03-03 DIAGNOSIS — Z6820 Body mass index (BMI) 20.0-20.9, adult: Secondary | ICD-10-CM | POA: Diagnosis not present

## 2024-03-03 DIAGNOSIS — Z0000 Encounter for general adult medical examination without abnormal findings: Secondary | ICD-10-CM | POA: Diagnosis not present

## 2024-03-03 DIAGNOSIS — M810 Age-related osteoporosis without current pathological fracture: Secondary | ICD-10-CM | POA: Diagnosis not present

## 2024-03-03 DIAGNOSIS — L400 Psoriasis vulgaris: Secondary | ICD-10-CM | POA: Diagnosis not present

## 2024-03-03 DIAGNOSIS — Z1331 Encounter for screening for depression: Secondary | ICD-10-CM | POA: Diagnosis not present

## 2024-03-03 DIAGNOSIS — R7303 Prediabetes: Secondary | ICD-10-CM | POA: Diagnosis not present

## 2024-03-21 DIAGNOSIS — U071 COVID-19: Secondary | ICD-10-CM | POA: Diagnosis not present

## 2024-03-21 DIAGNOSIS — R051 Acute cough: Secondary | ICD-10-CM | POA: Diagnosis not present

## 2024-03-21 DIAGNOSIS — Z6821 Body mass index (BMI) 21.0-21.9, adult: Secondary | ICD-10-CM | POA: Diagnosis not present

## 2024-03-22 DIAGNOSIS — E860 Dehydration: Secondary | ICD-10-CM | POA: Diagnosis not present

## 2024-03-22 DIAGNOSIS — R112 Nausea with vomiting, unspecified: Secondary | ICD-10-CM | POA: Diagnosis not present

## 2024-03-22 DIAGNOSIS — X58XXXA Exposure to other specified factors, initial encounter: Secondary | ICD-10-CM | POA: Diagnosis not present

## 2024-03-22 DIAGNOSIS — U071 COVID-19: Secondary | ICD-10-CM | POA: Diagnosis not present

## 2024-03-22 DIAGNOSIS — T50905A Adverse effect of unspecified drugs, medicaments and biological substances, initial encounter: Secondary | ICD-10-CM | POA: Diagnosis not present

## 2024-03-22 DIAGNOSIS — E871 Hypo-osmolality and hyponatremia: Secondary | ICD-10-CM | POA: Diagnosis not present

## 2024-03-22 DIAGNOSIS — Z79899 Other long term (current) drug therapy: Secondary | ICD-10-CM | POA: Diagnosis not present

## 2024-03-24 ENCOUNTER — Emergency Department (HOSPITAL_COMMUNITY)

## 2024-03-24 ENCOUNTER — Emergency Department (HOSPITAL_COMMUNITY)
Admission: EM | Admit: 2024-03-24 | Discharge: 2024-03-24 | Disposition: A | Discharge status: Home / Self Care | Attending: Student | Admitting: Student

## 2024-03-24 ENCOUNTER — Other Ambulatory Visit: Payer: Self-pay

## 2024-03-24 DIAGNOSIS — F458 Other somatoform disorders: Secondary | ICD-10-CM | POA: Diagnosis not present

## 2024-03-24 DIAGNOSIS — R112 Nausea with vomiting, unspecified: Secondary | ICD-10-CM | POA: Diagnosis not present

## 2024-03-24 DIAGNOSIS — Z853 Personal history of malignant neoplasm of breast: Secondary | ICD-10-CM | POA: Insufficient documentation

## 2024-03-24 DIAGNOSIS — Z8616 Personal history of COVID-19: Secondary | ICD-10-CM | POA: Diagnosis not present

## 2024-03-24 DIAGNOSIS — R1310 Dysphagia, unspecified: Secondary | ICD-10-CM | POA: Diagnosis not present

## 2024-03-24 DIAGNOSIS — R0789 Other chest pain: Secondary | ICD-10-CM | POA: Diagnosis not present

## 2024-03-24 DIAGNOSIS — R0989 Other specified symptoms and signs involving the circulatory and respiratory systems: Secondary | ICD-10-CM | POA: Insufficient documentation

## 2024-03-24 DIAGNOSIS — R079 Chest pain, unspecified: Secondary | ICD-10-CM | POA: Diagnosis not present

## 2024-03-24 DIAGNOSIS — Z6821 Body mass index (BMI) 21.0-21.9, adult: Secondary | ICD-10-CM | POA: Diagnosis not present

## 2024-03-24 DIAGNOSIS — R09A2 Foreign body sensation, throat: Secondary | ICD-10-CM

## 2024-03-24 DIAGNOSIS — R9431 Abnormal electrocardiogram [ECG] [EKG]: Secondary | ICD-10-CM | POA: Diagnosis not present

## 2024-03-24 LAB — CBC WITH DIFFERENTIAL/PLATELET
Abs Immature Granulocytes: 0.01 10*3/uL (ref 0.00–0.07)
Basophils Absolute: 0 10*3/uL (ref 0.0–0.1)
Basophils Relative: 0 %
Eosinophils Absolute: 0 10*3/uL (ref 0.0–0.5)
Eosinophils Relative: 1 %
HCT: 38.1 % (ref 36.0–46.0)
Hemoglobin: 12.8 g/dL (ref 12.0–15.0)
Immature Granulocytes: 0 %
Lymphocytes Relative: 20 %
Lymphs Abs: 1 10*3/uL (ref 0.7–4.0)
MCH: 31.3 pg (ref 26.0–34.0)
MCHC: 33.6 g/dL (ref 30.0–36.0)
MCV: 93.2 fL (ref 80.0–100.0)
Monocytes Absolute: 0.4 10*3/uL (ref 0.1–1.0)
Monocytes Relative: 8 %
Neutro Abs: 3.7 10*3/uL (ref 1.7–7.7)
Neutrophils Relative %: 71 %
Platelets: 192 10*3/uL (ref 150–400)
RBC: 4.09 MIL/uL (ref 3.87–5.11)
RDW: 11.8 % (ref 11.5–15.5)
WBC: 5.2 10*3/uL (ref 4.0–10.5)
nRBC: 0 % (ref 0.0–0.2)

## 2024-03-24 LAB — COMPREHENSIVE METABOLIC PANEL WITH GFR
ALT: 14 U/L (ref 0–44)
AST: 22 U/L (ref 15–41)
Albumin: 4.2 g/dL (ref 3.5–5.0)
Alkaline Phosphatase: 63 U/L (ref 38–126)
Anion gap: 14 (ref 5–15)
BUN: <5 mg/dL — ABNORMAL LOW (ref 8–23)
CO2: 24 mmol/L (ref 22–32)
Calcium: 9 mg/dL (ref 8.9–10.3)
Chloride: 90 mmol/L — ABNORMAL LOW (ref 98–111)
Creatinine, Ser: 0.48 mg/dL (ref 0.44–1.00)
GFR, Estimated: >60 mL/min (ref >60)
Glucose, Bld: 100 mg/dL — ABNORMAL HIGH (ref 70–99)
Potassium: 3.5 mmol/L (ref 3.5–5.1)
Sodium: 128 mmol/L — ABNORMAL LOW (ref 135–145)
Total Bilirubin: 0.8 mg/dL (ref 0.0–1.2)
Total Protein: 7 g/dL (ref 6.5–8.1)

## 2024-03-24 LAB — URINALYSIS, ROUTINE W REFLEX MICROSCOPIC
Bacteria, UA: NONE SEEN
Bilirubin Urine: NEGATIVE
Glucose, UA: NEGATIVE mg/dL
Ketones, ur: 20 mg/dL — AB
Leukocytes,Ua: NEGATIVE
Nitrite: NEGATIVE
Protein, ur: NEGATIVE mg/dL
Specific Gravity, Urine: 1.003 — ABNORMAL LOW (ref 1.005–1.030)
pH: 6 (ref 5.0–8.0)

## 2024-03-24 LAB — LIPASE, BLOOD: Lipase: 32 U/L (ref 11–51)

## 2024-03-24 LAB — TROPONIN I (HIGH SENSITIVITY): Troponin I (High Sensitivity): 5 ng/L (ref <18)

## 2024-03-24 MED ORDER — LIDOCAINE VISCOUS HCL 2 % MT SOLN
15.0000 mL | Freq: Once | OROMUCOSAL | Status: AC
  Administered 2024-03-24: 15 mL via ORAL
  Filled 2024-03-24: qty 15

## 2024-03-24 MED ORDER — ALUM & MAG HYDROXIDE-SIMETH 200-200-20 MG/5ML PO SUSP
30.0000 mL | Freq: Once | ORAL | Status: AC
  Administered 2024-03-24: 30 mL via ORAL
  Filled 2024-03-24: qty 30

## 2024-03-24 MED ORDER — ONDANSETRON HCL 4 MG/2ML IJ SOLN
4.0000 mg | Freq: Once | INTRAMUSCULAR | Status: AC
  Administered 2024-03-24: 4 mg via INTRAVENOUS
  Filled 2024-03-24: qty 2

## 2024-03-24 MED ORDER — SUCRALFATE 1 GM/10ML PO SUSP: 1.0000 g | Freq: Three times a day (TID) | ORAL | 0 refills | Status: AC

## 2024-03-24 NOTE — ED Provider Triage Note (Signed)
 Emergency Medicine Provider Triage Evaluation Note  Shelby Peterson , a 78 y.o. female  was evaluated in triage.  Pt states that after taking paxlovid she began to have nausea/vomiting, epigastric abdominal pain, chest tightness. Patient states she took 3 doses of the Paxlovid. Denies shortness of breath, fever, chills.   Review of Systems  Positive: Chest tightness, throat tightness, nausea, vomiting, abdominal pain, fatigue Negative: Urinary symptoms, shortness of breath, fever, chills   Physical Exam  BP (!) 149/63   Pulse 81   Temp 97.9 F (36.6 C) (Oral)   Resp 18   Ht 5\' 8"  (1.727 m)   Wt 59.4 kg   SpO2 100%   BMI 19.92 kg/m  Gen:   Awake, no distress   Resp:  Normal effort  MSK:   Moves extremities without difficulty  Other:  Epigastric abdominal pain, mildly tender to palpation, no rebound, no guarding, heart and lungs unremarkable  Medical Decision Making  Medically screening exam initiated at 5:28 PM.  Appropriate orders placed.  Shelby Peterson was informed that the remainder of the evaluation will be completed by another provider, this initial triage assessment does not replace that evaluation, and the importance of remaining in the ED until their evaluation is complete.  Orders: EKG, CXR, CBC, CMP, lipase, UA, troponin, zofran    Fonda Hymen, [place] 03/24/24 2119

## 2024-03-24 NOTE — ED Triage Notes (Signed)
 Pt sent from primary care for further eval, was dx with covid Monday and got sick after 3 pills of paxlovid. UC prescribed zofran and protonix with no relief. Pt having constant burning in throat and was throwing up all yesterday

## 2024-03-25 NOTE — ED Provider Notes (Signed)
 [place] EMERGENCY DEPARTMENT AT [facility] Provider Note  CSN: [id number] Arrival date & time: 03/24/24 1621  Chief Complaint(s) Emesis  HPI Shelby Peterson is a 78 y.o. female who presents Emergency Department for evaluation of sore throat and globus sensation after multiple episodes of nausea and vomiting.  Patient states that she was recently diagnosed with COVID and started on Paxlovid.  She took 2 doses of Paxlovid and had significant nausea vomiting.  Reportedly went to an emergency department in [place] yesterday where they prescribed Protonix, Zofran, promethazine and Carafate but she states she has had difficulty taking the medicine because the Carafate is in pill form and she feels that this gets stuck in her throat.  She endorses a persistent globus sensation and pain in the throat and into the upper chest.  Denies shortness of breath, abdominal pain, headache, fever or other systemic symptoms.   Past Medical History Past Medical History:  Diagnosis Date   Cancer (HCC)    Dyspnea    2D ECHO, 10/18/2012 - EF-60-65%, normal   Family history of early CAD    LEXISCAN , 10/19/2012 - normal   Fibromyalgia    Hyperlipidemia    Immune disorder (HCC)    Osteoporosis    Sjoegren syndrome    Visual disorder    Patient Active Problem List   Diagnosis Date Noted   Dizziness 06/27/2022   History of breast cancer 11/09/2020   Dupuytren's contracture 03/16/2018   TBI (traumatic brain injury) (HCC) 12/18/2016   Hyperlipidemia 11/04/2013   Family history of heart disease 11/04/2013   Fibromyalgia 11/04/2013   Atrophic vaginitis 08/29/2012   Bladder outlet obstruction 08/29/2012   Home Medication(s) Prior to Admission medications   Medication Sig Start Date End Date Taking? Authorizing Provider  sucralfate (CARAFATE) 1 GM/10ML suspension Take 10 mLs (1 g total) by mouth 4 (four) times daily -  with meals and at bedtime. 03/24/24  Yes Desiderio Dolata, MD   acetaminophen  (TYLENOL ) 650 MG CR tablet Take 650 mg by mouth every 8 (eight) hours as needed for pain.    [provider]  acyclovir (ZOVIRAX) 400 MG tablet Take 400 mg by mouth 2 (two) times daily as needed.     [provider]  celecoxib (CELEBREX) 200 MG capsule Take by mouth. 01/22/21   [provider]  Cholecalciferol (VITAMIN D-3) 5000 UNITS TABS Take 1 tablet by mouth once a week.    [provider]  ezetimibe (ZETIA) 10 MG tablet Take 1 tablet by mouth daily. 02/05/21   [provider]  gabapentin (NEURONTIN) 300 MG capsule SMARTSIG:2-3 Capsule(s) By Mouth Daily 04/03/22   [provider]  HYDROcodone -Acetaminophen  10-325 MG/15ML SOLN Take 0.5 tablets by mouth daily as needed. 11/09/20   Gudena, Vinay, MD  Melatonin 5 MG TABS Take 5 mg by mouth at bedtime.    [provider]  Menaquinone-7 (VITAMIN K2) 100 MCG CAPS Take 1 tablet by mouth daily.    [provider]  Multiple Vitamins-Minerals (PRESERVISION AREDS PO) Take 1 tablet by mouth daily.    [provider]  RESTASIS 0.05 % ophthalmic emulsion  12/23/21   [provider]  traZODone (DESYREL) 100 MG tablet Take 100 mg by mouth at bedtime.    [provider]  Past Surgical History Past Surgical History:  Procedure Laterality Date   MASTECTOMY Bilateral    TUMOR REMOVAL     Benign on L ovary    Family History Family History  Problem Relation Age of Onset   Dementia Mother    Cancer Mother        Breast cancer   Diabetes Father    Hyperlipidemia Father    Heart disease Father    Stroke Maternal Grandfather    Hypertension Maternal Grandfather    Diabetes Paternal Grandmother    Cancer Paternal Grandmother        Ovarian cancer   Cancer Paternal Grandfather        Prostate cancer   Sleep apnea Child      Social History Social History   Tobacco Use   Smoking status: Never   Smokeless tobacco: Never  Substance Use Topics   Alcohol use: Yes    Comment: 1 glass of wine a month    Drug use: No   Allergies Patient has no known allergies.  Review of Systems Review of Systems  Gastrointestinal:  Positive for nausea and vomiting.    Physical Exam Vital Signs  I have reviewed the triage vital signs BP (!) 131/58 (BP Location: Right Arm)   Pulse 72   Temp 98.2 F (36.8 C) (Oral)   Resp 15   Ht 5\' 8"  (1.727 m)   Wt 59.4 kg   SpO2 96%   BMI 19.92 kg/m   Physical Exam Vitals and nursing note reviewed.  Constitutional:      General: She is not in acute distress.    Appearance: She is well-developed.  HENT:     Head: Normocephalic and atraumatic.  Eyes:     Conjunctiva/sclera: Conjunctivae normal.  Cardiovascular:     Rate and Rhythm: Normal rate and regular rhythm.     Heart sounds: No murmur heard. Pulmonary:     Effort: Pulmonary effort is normal. No respiratory distress.     Breath sounds: Normal breath sounds.  Abdominal:     Palpations: Abdomen is soft.     Tenderness: There is no abdominal tenderness.  Musculoskeletal:        General: No swelling.     Cervical back: Neck supple.  Skin:    General: Skin is warm and dry.     Capillary Refill: Capillary refill takes less than 2 seconds.  Neurological:     Mental Status: She is alert.  Psychiatric:        Mood and Affect: Mood normal.     ED Results and Treatments Labs (all labs ordered are listed, but only abnormal results are displayed) Labs Reviewed  COMPREHENSIVE METABOLIC PANEL WITH GFR - Abnormal; Notable for the following components:      Result Value   Sodium 128 (*)    Chloride 90 (*)    Glucose, Bld 100 (*)    BUN <5 (*)    All other components within normal limits  URINALYSIS, ROUTINE W REFLEX MICROSCOPIC - Abnormal; Notable for the following components:   Color, Urine STRAW (*)     Specific Gravity, Urine 1.003 (*)    Hgb urine dipstick SMALL (*)    Ketones, ur 20 (*)    All other components within normal limits  CBC WITH DIFFERENTIAL/PLATELET  LIPASE, BLOOD  TROPONIN I (HIGH SENSITIVITY)  Radiology DG Chest 2 View Result Date: 03/24/2024 CLINICAL DATA:  chest pain EXAM: CHEST - 2 VIEW COMPARISON:  March 22, 2024 FINDINGS: Left breast/axillary surgical clips. No focal airspace consolidation, pleural effusion, or pneumothorax. No cardiomegaly. No acute fracture or destructive lesion. IMPRESSION: No acute cardiopulmonary abnormality. Electronically Signed   By: Rance Burrows M.D.   On: 03/24/2024 18:25    Pertinent labs & imaging results that were available during my care of the patient were reviewed by me and considered in my medical decision making (see MDM for details).  Medications Ordered in ED Medications  ondansetron (ZOFRAN) injection 4 mg (4 mg Intravenous Given 03/24/24 2018)  alum & mag hydroxide-simeth (MAALOX/MYLANTA) 200-200-20 MG/5ML suspension 30 mL (30 mLs Oral Given 03/24/24 2113)    And  lidocaine  (XYLOCAINE ) 2 % viscous mouth solution 15 mL (15 mLs Oral Given 03/24/24 2113)                                                                                                                                     Procedures Procedures  (including critical care time)  Medical Decision Making / ED Course   This patient presents to the ED for concern of globus sensation, nausea, vomiting, this involves an extensive number of treatment options, and is a complaint that carries with it a high risk of complications and morbidity.  The differential diagnosis includes globus, gastritis, esophagitis, esophageal stricture, mass  MDM: Patient seen emergency room for evaluation of a persistent globus sensation in the setting of nausea and vomiting.   Physical exam is unremarkable.  Laboratory evaluation largely unremarkable outside of a sodium of 128.  She is not endorsing significant fatigue and I have low suspicion that this is clinically relevant today but should be followed up in the outpatient setting.  Suspect that this may be due to decreased p.o. intake as she has not been eating well over the last few days.  Patient given a GI cocktail with viscous lidocaine  and on reevaluation she states her symptoms have improved.  She is able to tolerate p.o. without difficulty.  We will change her Carafate prescription to the p.o. solution to increase compliance and tolerance but at this time she does not meet inpatient criteria for admission.  I placed an outpatient gastroenterology referral.  Given return precautions of which she voiced understanding she was discharged   Additional history obtained: -Additional history obtained from husband -External records from outside source obtained and reviewed including: Chart review including previous notes, labs, imaging, consultation notes   Lab Tests: -I ordered, reviewed, and interpreted labs.   The pertinent results include:   Labs Reviewed  COMPREHENSIVE METABOLIC PANEL WITH GFR - Abnormal; Notable for the following components:      Result Value   Sodium 128 (*)    Chloride 90 (*)    Glucose, Bld 100 (*)    BUN <5 (*)  All other components within normal limits  URINALYSIS, ROUTINE W REFLEX MICROSCOPIC - Abnormal; Notable for the following components:   Color, Urine STRAW (*)    Specific Gravity, Urine 1.003 (*)    Hgb urine dipstick SMALL (*)    Ketones, ur 20 (*)    All other components within normal limits  CBC WITH DIFFERENTIAL/PLATELET  LIPASE, BLOOD  TROPONIN I (HIGH SENSITIVITY)      EKG   EKG Interpretation Date/Time:  Thursday March 24 2024 21:18:48 EDT Ventricular Rate:  69 PR Interval:  149 QRS Duration:  88 QT Interval:  433 QTC Calculation: 464 R Axis:   60  Text  Interpretation: Sinus rhythm Confirmed by Ameer Sanden ([id number]) on 03/25/2024 1:23:30 AM         Imaging Studies ordered: I ordered imaging studies including chest x-ray I independently visualized and interpreted imaging. I agree with the radiologist interpretation   Medicines ordered and prescription drug management: Meds ordered this encounter  Medications   ondansetron (ZOFRAN) injection 4 mg   AND Linked Order Group    alum & mag hydroxide-simeth (MAALOX/MYLANTA) 200-200-20 MG/5ML suspension 30 mL    lidocaine  (XYLOCAINE ) 2 % viscous mouth solution 15 mL   sucralfate (CARAFATE) 1 GM/10ML suspension    Sig: Take 10 mLs (1 g total) by mouth 4 (four) times daily -  with meals and at bedtime.    Dispense:  420 mL    Refill:  0    -I have reviewed the patients home medicines and have made adjustments as needed  Critical interventions none    Cardiac Monitoring: The patient was maintained on a cardiac monitor.  I personally viewed and interpreted the cardiac monitored which showed an underlying rhythm of: NSR  Social Determinants of Health:  Factors impacting patients care include: none   Reevaluation: After the interventions noted above, I reevaluated the patient and found that they have :improved  Co morbidities that complicate the patient evaluation  Past Medical History:  Diagnosis Date   Cancer (HCC)    Dyspnea    2D ECHO, 10/18/2012 - EF-60-65%, normal   Family history of early CAD    LEXISCAN , 10/19/2012 - normal   Fibromyalgia    Hyperlipidemia    Immune disorder (HCC)    Osteoporosis    Sjoegren syndrome    Visual disorder       Dispostion: I considered admission for this patient, but at this time she does not meet patient criteria for admission and will be discharged with outpatient follow-up     Final Clinical Impression(s) / ED Diagnoses Final diagnoses:  Globus sensation  Nausea and vomiting, unspecified vomiting type      @PCDICTATION @    Karlyn Overman, MD 03/25/24 [phone]

## 2024-03-29 ENCOUNTER — Encounter: Payer: Self-pay | Admitting: Gastroenterology

## 2024-03-29 ENCOUNTER — Ambulatory Visit: Admitting: Gastroenterology

## 2024-03-29 VITALS — BP 122/64 | HR 84 | Ht 65.0 in | Wt 129.0 lb

## 2024-03-29 DIAGNOSIS — R110 Nausea: Secondary | ICD-10-CM

## 2024-03-29 DIAGNOSIS — K5904 Chronic idiopathic constipation: Secondary | ICD-10-CM | POA: Diagnosis not present

## 2024-03-29 DIAGNOSIS — R12 Heartburn: Secondary | ICD-10-CM

## 2024-03-29 DIAGNOSIS — R1013 Epigastric pain: Secondary | ICD-10-CM | POA: Diagnosis not present

## 2024-03-29 NOTE — Progress Notes (Signed)
 Chief Complaint: GERD, chest pain, nausea Primary GI Doctor: Dr. Rosaline Coma  HPI:  Patient is a  78  year old female patient with past medical history of hyperlipidemia, Sjoegren syndrome, Fibromyalgia, who was referred to me by Jimmey Mould, MD on 03/24/24 for a complaint of GERD, chest pain, nausea.    03/24/24 Patient presented to ED for evaluation of sore throat and globus sensation after multiple episodes of nausea and vomiting. Patient states that she was recently diagnosed with COVID and started on Paxlovid. She took 2 doses of Paxlovid and had significant nausea vomiting. Reportedly went to an emergency department in [place] yesterday where they prescribed Protonix, Zofran , promethazine and Carafate  but she states she has had difficulty taking the medicine because the Carafate  is in pill form and she feels that this gets stuck in her throat. She endorses a persistent globus sensation and pain in the throat and into the upper chest.  Laboratory evaluation largely unremarkable outside of a sodium of 128. Chest xray with no acute cardiopulmonary abnormality.   Interval History    Patient had Covid- 19 end of [place]aven Criswell and her PCP prescribed Paxlovid. She states she started the medication and after taking a few doses she started to experience burning in throat all the way down into her esophagus and sternum. She states she had some tightness in her throat and had to drink plenty of water throughout the day to ease it.  So she stopped the medication. She has continued with intermittent pyrosis and dyspepsia so she has been taking Protonix 40 mg as needed. She had concerns taking it long term due to history of osteoporosis. She has also tried the Carafate  suspension but only twice daily which helps some.    She also has chronic constipation, last bowel was end of Tallie Dodds. She took OTC stool softners last night and will take again today. Small amount of stool came back. She recalls being given Linzess in the  past low dose but did not think it helped.  She has not had any hematemesis. She has not had any dark tarry stools.   No blood thinners.  She was taking daily Celebrex for scoliosis but since she has been sick she switched to OTC Tylenol .   Patients last colonoscopy was last year with Dr. Lavaughn Portland, per patient normal. She plans to transfer her care to our office.  Patients last EGD was in her early 30s for some GI issues. Normal per patient.   Patient's family history includes personal history of breast CA  Wt Readings from Last 3 Encounters:  03/29/24 129 lb (58.5 kg)  03/24/24 131 lb (59.4 kg)  06/27/22 129 lb 3.2 oz (58.6 kg)    Past Medical History:  Diagnosis Date   breast ca    Dyspnea    2D ECHO, 10/18/2012 - EF-60-65%, normal   Family history of early CAD    LEXISCAN , 10/19/2012 - normal   Fibromyalgia    Hyperlipidemia    Immune disorder (HCC)    Interstitial cystitis    Osteoarthritis    Osteoporosis    Scoliosis    Sjoegren syndrome    Visual disorder     Past Surgical History:  Procedure Laterality Date   APPENDECTOMY     MASTECTOMY Bilateral    TUMOR REMOVAL     Benign on L ovary     Current Outpatient Medications  Medication Sig Dispense Refill   acetaminophen  (TYLENOL ) 650 MG CR tablet Take 650 mg by  mouth every 8 (eight) hours as needed for pain.     acyclovir (ZOVIRAX) 400 MG tablet Take 400 mg by mouth 2 (two) times daily as needed.      calcipotriene (DOVONOX) 0.005 % ointment Apply 1 Application topically 2 (two) times daily.     celecoxib (CELEBREX) 200 MG capsule Take 200-400 mg by mouth daily.     Cholecalciferol (VITAMIN D-3) 5000 UNITS TABS Take 1 tablet by mouth once a week.     Cyanocobalamin  (VITAMIN B12 PO) Take 1 tablet by mouth daily.     estradiol (ESTRACE) 0.1 MG/GM vaginal cream Place 1 Applicatorful vaginally 3 (three) times a week.     ezetimibe (ZETIA) 10 MG tablet Take 1 tablet by mouth daily.     gabapentin (NEURONTIN) 300 MG  capsule Take 1-3 capsules by mouth daily.     HYDROcodone -acetaminophen  (NORCO/VICODIN) 5-325 MG tablet Take 1 tablet by mouth every 6 (six) hours as needed.     Melatonin 5 MG TABS Take 5 mg by mouth at bedtime.     Menaquinone-7 (VITAMIN K2) 100 MCG CAPS Take 1 tablet by mouth daily.     methocarbamol (ROBAXIN) 500 MG tablet Take 500 mg by mouth as needed.     Multiple Vitamins-Minerals (PRESERVISION AREDS PO) Take 1 tablet by mouth daily.     ondansetron  (ZOFRAN -ODT) 4 MG disintegrating tablet Take 4 mg by mouth every 6 (six) hours as needed.     pantoprazole (PROTONIX) 40 MG tablet Take 40 mg by mouth daily.     RESTASIS 0.05 % ophthalmic emulsion      sucralfate  (CARAFATE ) 1 GM/10ML suspension Take 10 mLs (1 g total) by mouth 4 (four) times daily -  with meals and at bedtime. 420 mL 0   traZODone (DESYREL) 100 MG tablet Take 100 mg by mouth at bedtime.     No current facility-administered medications for this visit.    Allergies as of 03/29/2024 - Review Complete 03/29/2024  Allergen Reaction Noted   Budesonide  03/29/2024   Ciprofloxacin  03/29/2024   Ketorolac   03/29/2024   Mesalamine  03/29/2024   Statins  03/29/2024    Family History  Problem Relation Age of Onset   Dementia Mother    Breast cancer Mother    Diabetes Father    Hyperlipidemia Father    Heart disease Father    Hypertension Maternal Grandmother    Stroke Maternal Grandfather    Hypertension Maternal Grandfather    Diabetes Paternal Grandmother    Ovarian cancer Paternal Grandmother    Prostate cancer Paternal Grandfather    Sleep apnea Son     Review of Systems:    Constitutional: No weight loss, fever, chills, weakness or fatigue HEENT: Eyes: No change in vision               Ears, Nose, Throat:  No change in hearing or congestion Skin: No rash or itching Cardiovascular: No chest pain, chest pressure or palpitations   Respiratory: No SOB or cough Gastrointestinal: See HPI and otherwise  negative Genitourinary: No dysuria or change in urinary frequency Neurological: No headache, dizziness or syncope Musculoskeletal: No new muscle or joint pain Hematologic: No bleeding or bruising Psychiatric: No history of depression or anxiety    Physical Exam:  Vital signs: BP 122/64 (BP Location: Left Arm, Patient Position: Sitting, Cuff Size: Normal)   Pulse 84   Ht 5\' 5"  (1.651 m) Comment: height measured without shoes  Wt 129 lb (58.5 kg)  BMI 21.47 kg/m   Constitutional:  Pleasant  female appears to be in NAD, Well developed, Well nourished, alert and cooperative Throat: Oral cavity and pharynx without inflammation, swelling or lesion.  Respiratory: Respirations even and unlabored. Lungs clear to auscultation bilaterally.   No wheezes, crackles, or rhonchi.  Cardiovascular: Normal S1, S2. Regular rate and rhythm. No peripheral edema, cyanosis or pallor.  Gastrointestinal:  Soft, nondistended, upper abdominal tenderness. No rebound or guarding. Hypoactive bowel sounds. No appreciable masses or hepatomegaly. Rectal:  Not performed.  Msk:  Symmetrical without gross deformities. Without edema, no deformity or joint abnormality.  Neurologic:  Alert and  oriented x4;  grossly normal neurologically.  Skin:   Dry and intact without significant lesions or rashes. Psychiatric: Oriented to person, place and time. Demonstrates good judgement and reason without abnormal affect or behaviors.  RELEVANT LABS AND IMAGING: CBC    Latest Ref Rng & Units 03/24/2024    8:16 PM 04/10/2022   10:45 AM  CBC  WBC 4.0 - 10.5 K/uL 5.2  4.5   Hemoglobin 12.0 - 15.0 g/dL 16.1  09.6   Hematocrit 36.0 - 46.0 % 38.1  36.1   Platelets 150 - 400 K/uL 192  195      CMP     Latest Ref Rng & Units 03/24/2024    8:16 PM 04/10/2022   10:45 AM 03/23/2018   10:01 AM  CMP  Glucose 70 - 99 mg/dL 045  92    BUN 8 - 23 mg/dL <5  12    Creatinine 4.09 - 1.00 mg/dL 8.11  9.14    Sodium 782 - 145 mmol/L 128  136     Potassium 3.5 - 5.1 mmol/L 3.5  4.3    Chloride 98 - 111 mmol/L 90  97    CO2 22 - 32 mmol/L 24  28    Calcium 8.9 - 10.3 mg/dL 9.0  8.8    Total Protein 6.5 - 8.1 g/dL 7.0   7.0   Total Bilirubin 0.0 - 1.2 mg/dL 0.8   0.3   Alkaline Phos 38 - 126 U/L 63   85   AST 15 - 41 U/L 22   21   ALT 0 - 44 U/L 14   13   05/06/22 echo- Left ventricular ejection fraction, by estimation, is 55 to 60%.   Assessment: Encounter Diagnoses  Name Primary?   Dyspepsia Yes   Nausea without vomiting    Chronic idiopathic constipation    Pyrosis      78 year old female patient who recently had covid-19 and started Paxlovid. Shortly after starting medication she experienced increase pyrosis, dyspepsia, and nausea. Patient discontinued medication and has been using antiacids as needed along with sucralfate  which has helped some. Also recommended she avoid NSAID's which cause further stomach irritation. Recommend she take daily pepcid twice daily for a few weeks along with 1 more week of carafate . If no improvement in symptoms she agrees to proceed with further workup including lab work and EGD.   Patient has chronic constipation and we discussed how Carafate  could worsen her symptoms so I will give her samples of Linzess 145mcg po daily to see if this provides her relief.    Plan: -She can take Pepcid 20 mg twice daily -Cont Sucralfate  1gm twice daily for another week then discontinue  -Reinforced GERD diet, no late meals 3-4 hours before lying down  -No NSAID's -Can use antiemetics as needed -samples provided for Linzess 145  mcg po daily  -recommend EGD, she would like to hold off for now -recommend repeat lab work, she would like to hold off for now -will request her records from Dr. Achille Ache office  Thank you for the courtesy of this consult. Please call me with any questions or concerns.   Calinda Stockinger, FNP-C [facility] 03/29/2024, 2:49 PM  Cc: Jimmey Mould, MD

## 2024-03-29 NOTE — Patient Instructions (Addendum)
 Start Pepcid 20 mg 1 tablet 30-45 minutes before breakfast and dinner Recommend GERD diet, no late meals 3-4 hours before lying down  Avoid NSAID's ( ibuprofen, motrin, goody powders)  Samples of Linzess 145 mcg po daily 1 tablet 30-45 mins before first meal of the day with full glass of water  _______________________________________________________  If your blood pressure at your visit was 140/90 or greater, please contact your primary care physician to follow up on this.  _______________________________________________________  If you are age 5 or older, your body mass index should be between 23-30. Your Body mass index is 21.47 kg/m. If this is out of the aforementioned range listed, please consider follow up with your Primary Care Provider.  If you are age 77 or younger, your body mass index should be between 19-25. Your Body mass index is 21.47 kg/m. If this is out of the aformentioned range listed, please consider follow up with your Primary Care Provider.   ________________________________________________________  [facility] providers would like to encourage you to use MYCHART to communicate with providers for non-urgent requests or questions.  Due to long hold times on the telephone, sending your provider a message by [facility] may be a faster and more efficient way to get a response.  Please allow 48 business hours for a response.  Please remember that this is for non-urgent requests.  _______________________________________________________   Thank you for trusting me with your gastrointestinal care. Deanna May, RNP

## 2024-03-29 NOTE — Progress Notes (Signed)
 I agree with the assessment and plan as outlined by Ms. May.

## 2024-04-04 ENCOUNTER — Other Ambulatory Visit: Payer: Self-pay | Admitting: Gastroenterology

## 2024-04-04 DIAGNOSIS — R12 Heartburn: Secondary | ICD-10-CM

## 2024-04-04 DIAGNOSIS — K581 Irritable bowel syndrome with constipation: Secondary | ICD-10-CM

## 2024-04-04 MED ORDER — LINACLOTIDE 290 MCG PO CAPS: 290.0000 ug | ORAL_CAPSULE | Freq: Every day | ORAL | 0 refills | Status: AC

## 2024-04-04 MED ORDER — FAMOTIDINE 20 MG PO TABS: 20.0000 mg | ORAL_TABLET | Freq: Two times a day (BID) | ORAL | 2 refills | Status: AC

## 2024-04-04 NOTE — Progress Notes (Signed)
 Patient doing well on famotidine- sent RX Patient needs script for linzess 290mcg for constipation. Also sent in

## 2024-04-04 NOTE — Telephone Encounter (Signed)
 Pt was contacted in regard to My Chart message that she sent. Please see note below also. Pt stated that she took 3 stool softeners last night and had two small BM. ( Formed at first and then liquid) Pt stated that her last BM before that was on May 30th.    Pt stated that she stop taking  Carafate   as she thought that it made her constipated also. Pt stated that she has been taking the OTC Pepcid 20 mg twice daily and requested a prescription.   Pt stated that she is also wondering what else she can do to get her BM regulated and relieve the constipation. Please review and advise.

## 2024-04-07 DIAGNOSIS — L400 Psoriasis vulgaris: Secondary | ICD-10-CM | POA: Diagnosis not present

## 2024-06-17 DIAGNOSIS — M5413 Radiculopathy, cervicothoracic region: Secondary | ICD-10-CM | POA: Diagnosis not present

## 2024-06-17 DIAGNOSIS — M5412 Radiculopathy, cervical region: Secondary | ICD-10-CM | POA: Diagnosis not present

## 2024-06-21 ENCOUNTER — Other Ambulatory Visit: Payer: Self-pay

## 2024-06-21 DIAGNOSIS — L738 Other specified follicular disorders: Secondary | ICD-10-CM | POA: Diagnosis not present

## 2024-06-21 DIAGNOSIS — L400 Psoriasis vulgaris: Secondary | ICD-10-CM | POA: Diagnosis not present

## 2024-06-21 DIAGNOSIS — L821 Other seborrheic keratosis: Secondary | ICD-10-CM | POA: Diagnosis not present

## 2024-06-22 DIAGNOSIS — M5413 Radiculopathy, cervicothoracic region: Secondary | ICD-10-CM | POA: Diagnosis not present

## 2024-06-22 DIAGNOSIS — M5412 Radiculopathy, cervical region: Secondary | ICD-10-CM | POA: Diagnosis not present

## 2024-06-23 ENCOUNTER — Other Ambulatory Visit: Payer: Self-pay

## 2024-06-23 MED ORDER — TRULANCE 3 MG PO TABS: 3.0000 mg | ORAL_TABLET | Freq: Every day | ORAL | 0 refills | Status: AC

## 2024-06-27 ENCOUNTER — Other Ambulatory Visit (HOSPITAL_COMMUNITY): Payer: Self-pay

## 2024-06-27 ENCOUNTER — Telehealth: Payer: Self-pay

## 2024-06-27 NOTE — Telephone Encounter (Signed)
 Pharmacy Patient Advocate Encounter   Received notification from CoverMyMeds that prior authorization for Trulance  3MG  tablets is required/requested.   Insurance verification completed.   The patient is insured through [employer] .   Per test claim: PA required; PA submitted to above mentioned insurance via Latent Key/confirmation #/EOC [id number] Status is pending

## 2024-06-28 DIAGNOSIS — M25562 Pain in left knee: Secondary | ICD-10-CM | POA: Diagnosis not present

## 2024-06-28 DIAGNOSIS — M25561 Pain in right knee: Secondary | ICD-10-CM | POA: Diagnosis not present

## 2024-06-28 DIAGNOSIS — G8929 Other chronic pain: Secondary | ICD-10-CM | POA: Diagnosis not present

## 2024-06-28 NOTE — Telephone Encounter (Signed)
 Pharmacy Patient Advocate Encounter  Received notification from [employer] that Prior Authorization for Trulance  3MG  tablets has been APPROVED from 06-27-2024 to 10-19-2024   PA #/Case ID/Reference #: [id number]

## 2024-07-05 DIAGNOSIS — M419 Scoliosis, unspecified: Secondary | ICD-10-CM | POA: Diagnosis not present

## 2024-07-05 DIAGNOSIS — M4312 Spondylolisthesis, cervical region: Secondary | ICD-10-CM | POA: Diagnosis not present

## 2024-07-05 DIAGNOSIS — M160 Bilateral primary osteoarthritis of hip: Secondary | ICD-10-CM | POA: Diagnosis not present

## 2024-07-11 DIAGNOSIS — M47816 Spondylosis without myelopathy or radiculopathy, lumbar region: Secondary | ICD-10-CM | POA: Diagnosis not present

## 2024-07-11 DIAGNOSIS — M4150 Other secondary scoliosis, site unspecified: Secondary | ICD-10-CM | POA: Diagnosis not present

## 2024-07-11 DIAGNOSIS — M47812 Spondylosis without myelopathy or radiculopathy, cervical region: Secondary | ICD-10-CM | POA: Diagnosis not present

## 2024-07-12 DIAGNOSIS — R2689 Other abnormalities of gait and mobility: Secondary | ICD-10-CM | POA: Diagnosis not present

## 2024-07-12 DIAGNOSIS — M545 Low back pain, unspecified: Secondary | ICD-10-CM | POA: Diagnosis not present

## 2024-07-14 DIAGNOSIS — M545 Low back pain, unspecified: Secondary | ICD-10-CM | POA: Diagnosis not present

## 2024-07-14 DIAGNOSIS — R2689 Other abnormalities of gait and mobility: Secondary | ICD-10-CM | POA: Diagnosis not present

## 2024-07-19 DIAGNOSIS — M9904 Segmental and somatic dysfunction of sacral region: Secondary | ICD-10-CM | POA: Diagnosis not present

## 2024-07-19 DIAGNOSIS — M9905 Segmental and somatic dysfunction of pelvic region: Secondary | ICD-10-CM | POA: Diagnosis not present

## 2024-07-19 DIAGNOSIS — M545 Low back pain, unspecified: Secondary | ICD-10-CM | POA: Diagnosis not present

## 2024-07-19 DIAGNOSIS — M9903 Segmental and somatic dysfunction of lumbar region: Secondary | ICD-10-CM | POA: Diagnosis not present

## 2024-07-19 DIAGNOSIS — R2689 Other abnormalities of gait and mobility: Secondary | ICD-10-CM | POA: Diagnosis not present

## 2024-07-19 DIAGNOSIS — M5136 Other intervertebral disc degeneration, lumbar region with discogenic back pain only: Secondary | ICD-10-CM | POA: Diagnosis not present

## 2024-07-25 DIAGNOSIS — M9903 Segmental and somatic dysfunction of lumbar region: Secondary | ICD-10-CM | POA: Diagnosis not present

## 2024-07-25 DIAGNOSIS — M5136 Other intervertebral disc degeneration, lumbar region with discogenic back pain only: Secondary | ICD-10-CM | POA: Diagnosis not present

## 2024-07-25 DIAGNOSIS — M9905 Segmental and somatic dysfunction of pelvic region: Secondary | ICD-10-CM | POA: Diagnosis not present

## 2024-07-25 DIAGNOSIS — M9904 Segmental and somatic dysfunction of sacral region: Secondary | ICD-10-CM | POA: Diagnosis not present

## 2024-07-26 DIAGNOSIS — R2689 Other abnormalities of gait and mobility: Secondary | ICD-10-CM | POA: Diagnosis not present

## 2024-07-26 DIAGNOSIS — M545 Low back pain, unspecified: Secondary | ICD-10-CM | POA: Diagnosis not present

## 2024-07-27 DIAGNOSIS — R2689 Other abnormalities of gait and mobility: Secondary | ICD-10-CM | POA: Diagnosis not present

## 2024-07-27 DIAGNOSIS — M545 Low back pain, unspecified: Secondary | ICD-10-CM | POA: Diagnosis not present

## 2024-07-28 DIAGNOSIS — M9905 Segmental and somatic dysfunction of pelvic region: Secondary | ICD-10-CM | POA: Diagnosis not present

## 2024-07-28 DIAGNOSIS — M5136 Other intervertebral disc degeneration, lumbar region with discogenic back pain only: Secondary | ICD-10-CM | POA: Diagnosis not present

## 2024-07-28 DIAGNOSIS — M9903 Segmental and somatic dysfunction of lumbar region: Secondary | ICD-10-CM | POA: Diagnosis not present

## 2024-07-28 DIAGNOSIS — M9904 Segmental and somatic dysfunction of sacral region: Secondary | ICD-10-CM | POA: Diagnosis not present

## 2024-08-01 DIAGNOSIS — M5136 Other intervertebral disc degeneration, lumbar region with discogenic back pain only: Secondary | ICD-10-CM | POA: Diagnosis not present

## 2024-08-01 DIAGNOSIS — M9903 Segmental and somatic dysfunction of lumbar region: Secondary | ICD-10-CM | POA: Diagnosis not present

## 2024-08-01 DIAGNOSIS — M9904 Segmental and somatic dysfunction of sacral region: Secondary | ICD-10-CM | POA: Diagnosis not present

## 2024-08-01 DIAGNOSIS — M9905 Segmental and somatic dysfunction of pelvic region: Secondary | ICD-10-CM | POA: Diagnosis not present

## 2024-08-02 DIAGNOSIS — R2689 Other abnormalities of gait and mobility: Secondary | ICD-10-CM | POA: Diagnosis not present

## 2024-08-02 DIAGNOSIS — M545 Low back pain, unspecified: Secondary | ICD-10-CM | POA: Diagnosis not present

## 2024-08-03 DIAGNOSIS — M9903 Segmental and somatic dysfunction of lumbar region: Secondary | ICD-10-CM | POA: Diagnosis not present

## 2024-08-03 DIAGNOSIS — M9904 Segmental and somatic dysfunction of sacral region: Secondary | ICD-10-CM | POA: Diagnosis not present

## 2024-08-03 DIAGNOSIS — M5136 Other intervertebral disc degeneration, lumbar region with discogenic back pain only: Secondary | ICD-10-CM | POA: Diagnosis not present

## 2024-08-03 DIAGNOSIS — M9905 Segmental and somatic dysfunction of pelvic region: Secondary | ICD-10-CM | POA: Diagnosis not present

## 2024-08-05 DIAGNOSIS — R2689 Other abnormalities of gait and mobility: Secondary | ICD-10-CM | POA: Diagnosis not present

## 2024-08-05 DIAGNOSIS — M545 Low back pain, unspecified: Secondary | ICD-10-CM | POA: Diagnosis not present

## 2024-08-09 DIAGNOSIS — R2689 Other abnormalities of gait and mobility: Secondary | ICD-10-CM | POA: Diagnosis not present

## 2024-08-09 DIAGNOSIS — M545 Low back pain, unspecified: Secondary | ICD-10-CM | POA: Diagnosis not present

## 2024-08-10 DIAGNOSIS — M9904 Segmental and somatic dysfunction of sacral region: Secondary | ICD-10-CM | POA: Diagnosis not present

## 2024-08-10 DIAGNOSIS — M9905 Segmental and somatic dysfunction of pelvic region: Secondary | ICD-10-CM | POA: Diagnosis not present

## 2024-08-10 DIAGNOSIS — M9903 Segmental and somatic dysfunction of lumbar region: Secondary | ICD-10-CM | POA: Diagnosis not present

## 2024-08-10 DIAGNOSIS — M5136 Other intervertebral disc degeneration, lumbar region with discogenic back pain only: Secondary | ICD-10-CM | POA: Diagnosis not present

## 2024-08-16 DIAGNOSIS — R2689 Other abnormalities of gait and mobility: Secondary | ICD-10-CM | POA: Diagnosis not present

## 2024-08-16 DIAGNOSIS — M545 Low back pain, unspecified: Secondary | ICD-10-CM | POA: Diagnosis not present

## 2024-08-17 DIAGNOSIS — M9903 Segmental and somatic dysfunction of lumbar region: Secondary | ICD-10-CM | POA: Diagnosis not present

## 2024-08-17 DIAGNOSIS — M5136 Other intervertebral disc degeneration, lumbar region with discogenic back pain only: Secondary | ICD-10-CM | POA: Diagnosis not present

## 2024-08-17 DIAGNOSIS — M9904 Segmental and somatic dysfunction of sacral region: Secondary | ICD-10-CM | POA: Diagnosis not present

## 2024-08-17 DIAGNOSIS — M9905 Segmental and somatic dysfunction of pelvic region: Secondary | ICD-10-CM | POA: Diagnosis not present

## 2024-08-23 DIAGNOSIS — R2689 Other abnormalities of gait and mobility: Secondary | ICD-10-CM | POA: Diagnosis not present

## 2024-08-23 DIAGNOSIS — M545 Low back pain, unspecified: Secondary | ICD-10-CM | POA: Diagnosis not present

## 2024-08-24 DIAGNOSIS — M9903 Segmental and somatic dysfunction of lumbar region: Secondary | ICD-10-CM | POA: Diagnosis not present

## 2024-08-24 DIAGNOSIS — M9904 Segmental and somatic dysfunction of sacral region: Secondary | ICD-10-CM | POA: Diagnosis not present

## 2024-08-24 DIAGNOSIS — M5136 Other intervertebral disc degeneration, lumbar region with discogenic back pain only: Secondary | ICD-10-CM | POA: Diagnosis not present

## 2024-08-24 DIAGNOSIS — M9905 Segmental and somatic dysfunction of pelvic region: Secondary | ICD-10-CM | POA: Diagnosis not present

## 2024-09-01 DIAGNOSIS — R2689 Other abnormalities of gait and mobility: Secondary | ICD-10-CM | POA: Diagnosis not present

## 2024-09-01 DIAGNOSIS — M545 Low back pain, unspecified: Secondary | ICD-10-CM | POA: Diagnosis not present

## 2024-09-06 ENCOUNTER — Ambulatory Visit: Admitting: Sports Medicine

## 2024-09-06 VITALS — BP 110/68 | Ht 66.0 in | Wt 129.0 lb

## 2024-09-06 DIAGNOSIS — R269 Unspecified abnormalities of gait and mobility: Secondary | ICD-10-CM | POA: Insufficient documentation

## 2024-09-06 DIAGNOSIS — M4120 Other idiopathic scoliosis, site unspecified: Secondary | ICD-10-CM | POA: Insufficient documentation

## 2024-09-06 DIAGNOSIS — M4105 Infantile idiopathic scoliosis, thoracolumbar region: Secondary | ICD-10-CM

## 2024-09-06 NOTE — Progress Notes (Signed)
 Chief complaint; difficulty with walking partly due to to significant leg length inequality and severe scoliosis  Patient is trying to stay fit but has difficulty with walking any distance.  She gets pain often in both hips but particularly on the left side.  She tries to stay fit and exercises but this is the significant limitation.  She was referred by her primary care physician to see if perhaps a custom orthotic could help her walk more efficiently.  In review her scoliotic curve is about 55 degrees and she has chronic low thoracic and lumbar spasm on the right low back.  She has an S-shaped curve concave to the left thoracic concave to the right lumbar.  I reviewed x-rays that she brought today.  She does feel more foot pain on the right side particularly around the right great toe with more walking.  Physical exam Thin older lady in no acute distress BP 110/68   Ht 5' 6 (1.676 m)   Wt 129 lb (58.5 kg)   BMI 20.82 kg/m   Standing her right hemipelvis sits at least 2 to 3 cm higher than her left and is forward rotated Her back shows a thoracic curve starting at about T3 or 4 con K to the left and then in the lumbar area the curve rotates and is concave to the right down to the L4 and 5 There is spasm over her right quadratus lumborum She has actually maintained her posture pretty well and does not show any true kyphosis Significant drop in shoulder height on the left  Measuring leg lengths when sitting or lying show 2 to 3 cm difference with the right being longer  The right first MTP joint shows breakdown and arthritic change with some limited motion Right foot shows mild pronation starting at the midfoot Left foot is neutral  Walking gait shows a significant Trendelenburg drop to the left with a horizontal hip and pelvis shift on each step to the left

## 2024-09-06 NOTE — Patient Instructions (Signed)
 Dr. Harvey evaluated you today and found that you have a large functional left leg discrepancy, scoliosis and thoracic muscle tightness/rotation. You would benefit from Pilates. We've given you the number for Arland Ona who is a Psychologist, [employer]. You were given a series of exercises to help strengthen your left hip abductors and work on the mobility in your trunk.

## 2024-09-06 NOTE — Assessment & Plan Note (Signed)
 Patient was fitted for a : standard, cushioned, semi-rigid orthotic. The orthotic was heated and afterward the patient stood on the orthotic blank positioned on the orthotic stand. The patient was positioned in subtalar neutral position and 10 degrees of ankle dorsiflexion in a weight bearing stance. After completion of molding, a stable base was applied to the orthotic blank. The blank was ground to a stable position for weight bearing. Size: 9 fit and run blue EVA Base: incorporated Posting: 1 cm 3/4 lift to left orthotic base Additional orthotic padding: RT first ray post  At completion of the custom orthotic she had a significant improvement in her gait.  The Trendelenburg was pretty much down and the shift to the left was minimal. These felt different to the patient but were not uncomfortable

## 2024-09-06 NOTE — Assessment & Plan Note (Signed)
 I feel she could improve her spasm and symptoms with some directed exercises  I gave her a series of stretches and rotational trunk exercises to try to compensate for the shortening of soft tissue in both the thoracic and lumbar areas  She will try these at home and then I would like to see her back in a couple months to assess her progress

## 2024-09-12 ENCOUNTER — Encounter: Payer: Self-pay | Admitting: Sports Medicine

## 2024-09-20 DIAGNOSIS — M9904 Segmental and somatic dysfunction of sacral region: Secondary | ICD-10-CM | POA: Diagnosis not present

## 2024-09-20 DIAGNOSIS — M5136 Other intervertebral disc degeneration, lumbar region with discogenic back pain only: Secondary | ICD-10-CM | POA: Diagnosis not present

## 2024-09-20 DIAGNOSIS — M9903 Segmental and somatic dysfunction of lumbar region: Secondary | ICD-10-CM | POA: Diagnosis not present

## 2024-09-20 DIAGNOSIS — M9905 Segmental and somatic dysfunction of pelvic region: Secondary | ICD-10-CM | POA: Diagnosis not present

## 2024-10-19 ENCOUNTER — Telehealth: Payer: Self-pay

## 2024-10-19 NOTE — Telephone Encounter (Signed)
 Pharmacy Patient Advocate Encounter   Received notification from CoverMyMeds that prior authorization for Trulance  3MG  tablets is required/requested.   Insurance verification completed.   The patient is insured through [employer].   Per test claim: [id number]

## 2024-10-21 NOTE — Telephone Encounter (Signed)
 Spoke with patient & she feels that Trulance  did not help with symptoms, and is requesting an OV in February to discuss other alternatives. OV scheduled for 2/2 at 10:00 am with Deanna, NP.

## 2024-11-21 ENCOUNTER — Ambulatory Visit: Admitting: Gastroenterology

## 2024-12-13 ENCOUNTER — Ambulatory Visit: Admitting: Gastroenterology
